# Patient Record
Sex: Male | Born: 1950 | Race: White | Hispanic: Yes | Marital: Married | Smoking: Never smoker
Health system: Southern US, Community
[De-identification: ages and names within clinical notes are randomized; demographics above are authoritative.]

## PROBLEM LIST (undated history)

## (undated) DIAGNOSIS — R569 Unspecified convulsions: Secondary | ICD-10-CM

## (undated) DIAGNOSIS — I639 Cerebral infarction, unspecified: Secondary | ICD-10-CM

## (undated) HISTORY — DX: Unspecified convulsions: R56.9

---

## 2016-03-26 ENCOUNTER — Ambulatory Visit (INDEPENDENT_AMBULATORY_CARE_PROVIDER_SITE_OTHER): Payer: Self-pay

## 2016-03-26 ENCOUNTER — Ambulatory Visit (INDEPENDENT_AMBULATORY_CARE_PROVIDER_SITE_OTHER): Payer: Self-pay | Admitting: Urgent Care

## 2016-03-26 VITALS — BP 142/84 | HR 61 | Temp 98.1°F | Resp 17 | Ht 66.0 in | Wt 164.0 lb

## 2016-03-26 DIAGNOSIS — M5442 Lumbago with sciatica, left side: Secondary | ICD-10-CM

## 2016-03-26 DIAGNOSIS — M549 Dorsalgia, unspecified: Secondary | ICD-10-CM

## 2016-03-26 LAB — POCT CBC
GRANULOCYTE PERCENT: 62.5 % (ref 37–80)
HCT, POC: 47.7 % (ref 43.5–53.7)
Hemoglobin: 16.6 g/dL (ref 14.1–18.1)
Lymph, poc: 2.5 (ref 0.6–3.4)
MCH: 30.3 pg (ref 27–31.2)
MCHC: 34.8 g/dL (ref 31.8–35.4)
MCV: 87.1 fL (ref 80–97)
MID (cbc): 0.4 (ref 0–0.9)
MPV: 7.6 fL (ref 0–99.8)
PLATELET COUNT, POC: 245 10*3/uL (ref 142–424)
POC Granulocyte: 4.9 (ref 2–6.9)
POC LYMPH %: 32.2 % (ref 10–50)
POC MID %: 5.3 %M (ref 0–12)
RBC: 5.47 M/uL (ref 4.69–6.13)
RDW, POC: 13.1 %
WBC: 7.9 10*3/uL (ref 4.6–10.2)

## 2016-03-26 LAB — POCT URINALYSIS DIP (MANUAL ENTRY)
BILIRUBIN UA: NEGATIVE
BILIRUBIN UA: NEGATIVE
Blood, UA: NEGATIVE
Glucose, UA: NEGATIVE
LEUKOCYTES UA: NEGATIVE
Nitrite, UA: NEGATIVE
Protein Ur, POC: NEGATIVE
Spec Grav, UA: 1.025
Urobilinogen, UA: 0.2
pH, UA: 5

## 2016-03-26 LAB — POC MICROSCOPIC URINALYSIS (UMFC): Mucus: ABSENT

## 2016-03-26 MED ORDER — PREDNISONE 20 MG PO TABS: ORAL_TABLET | ORAL | 0 refills | Status: DC

## 2016-03-26 NOTE — Patient Instructions (Addendum)
Tylenol Tome 500mg  cada 6 horas o tome 750mg  cada 8 horas para dolor y inflammacion.    Citica  (Sciatica)  La citica es Chief Technology Officer, debilidad, entumecimiento u hormigueo a lo largo del nervio citico. El nervio comienza en la zona inferior de la espalda y desciende por la parte posterior de cada pierna. El nervio controla los msculos de la parte inferior de la pierna y de la zona posterior de la rodilla, y transmite la sensibilidad a la parte posterior del muslo, la pierna y la planta del pie. La citica es un sntoma de otras afecciones mdicas. Por ejemplo, un dao a los nervios o algunas enfermedades como un disco herniado o un espoln seo en la columna vertebral, podran daarle o presionar en el nervio citico. Esto causa dolor, debilidad y otras sensaciones normalmente asociadas con la citica. Generalmente la citica afecta slo un lado del cuerpo. CAUSAS   Disco herniado o desplazado.  Enfermedad degenerativa del disco.  Un sndrome doloroso que compromete un msculo angosto de los glteos (sndrome piriforme).  Lesin o fractura plvica.  Embarazo.  Tumor (casos raros). SNTOMAS  Los sntomas pueden variar de leves a muy graves. Por lo general, los sntomas descienden desde la zona lumbar a las nalgas y la parte posterior de la pierna. Ellos son:   Hormigueo leve o dolor sordo en la parte inferior de la espalda, la pierna o la cadera.  Adormecimiento en la parte posterior de la pantorrilla o la planta del pie.  Sensacin de [employer] zona lumbar, la pierna o la cadera.  Dolor agudo en la zona inferior de la espalda, la pierna o la cadera.  Debilidad en las piernas.  Dolor de espalda intenso que [employer] movimientos. Los sntomas pueden empeorar al toser, Engineering geologist, rer o estar sentado o parado durante [employer]. Adems, el sobrepeso puede empeorar los sntomas.  DIAGNSTICO  Su mdico le har un examen fsico para buscar los sntomas comunes de la citica.  Le pedir que haga algunos movimientos o actividades que activaran el dolor del nervio citico. Para encontrar las causas de la citica podr indicarle otros estudios. Estos pueden ser:   Anlisis de [place].  Radiografas.  Pruebas de diagnstico por imgenes, como resonancia magntica o tomografa computada. TRATAMIENTO  El tratamiento se dirige a las causas de la citica. A veces, el tratamiento no es necesario, y Chief Technology Officer y Environmental health practitioner desaparecen por s mismos. Si necesita tratamiento, su mdico puede sugerir:   Medicamentos de venta libre para Engineer, materials.  Medicamentos recetados, como antiinflamatorios, relajantes musculares o narcticos.  Aplicacin de calor o hielo en la zona del dolor.  Inyecciones de corticoides para disminuir el dolor, la irritacin y la inflamacin alrededor del nervio.  Reduccin de la [employer] perodos de [place].  Ejercicios y estiramiento del abdomen para fortalecer y Scientist, clinical (histocompatibility and immunogenetics) la flexibilidad de la columna vertebral. Su mdico puede sugerirle perder peso si el peso extra empeora el dolor de espalda.  Fisioterapia.  La ciruga para eliminar lo que presiona o pincha el nervio, como un espoln seo o parte de una hernia de disco. INSTRUCCIONES PARA EL CUIDADO EN EL HOGAR   Slo tome medicamentos de venta libre o recetados para Primary school teacher o Environmental health practitioner, segn las indicaciones de su mdico.  Aplique hielo sobre el rea dolorida durante 20 minutos 3-4 veces por da durante los primeras 48-72 horas. Luego intente aplicar calor de la misma manera.  Haga ejercicios, elongue o realice  sus actividades habituales, si no le causan ms dolor.  Cumpla con todas las sesiones de fisioterapia, segn le indique su mdico.  Cumpla con todas las visitas de control, segn le indique su mdico.  No use tacones altos o zapatos que no tengan buen apoyo.  Verifique que el colchn no sea muy blando. Un colchn firme Engineer, materialsaliviar el dolor y las  [place]molestias. SOLICITE ATENCIN MDICA DE INMEDIATO SI:   Pierde el control de la vejiga o del intestino (incontinencia).  Aumenta la debilidad en la zona inferior de la espalda, la pelvis, las nalgas o las piernas.  Siente irritacin o inflamacin en la espalda.  Tiene sensacin de ardor al ConocoPhillipsorinar.  El dolor empeora cuando se acuesta o lo despierta por la noche.  El dolor es peor del que experiment en el pasado.  Dura ms de 4 semanas.  Pierde peso sin motivo de [place]era sbita. ASEGRESE DE QUE:   Comprende estas instrucciones.  Controlar su enfermedad.  Solicitar ayuda de inmediato si no mejora o si empeora.   Esta informacin no tiene Theme park managercomo fin reemplazar el consejo del mdico. Asegrese de hacerle al mdico cualquier pregunta que tenga.   Document Released: 07/19/2005 Document Revised: 04/09/2015 Elsevier Interactive Patient Education [employer]2016 Elsevier Inc.     IF you received an x-ray today, you will receive an invoice from [facility]. Please contact [facility] at [phone] with questions or concerns regarding your invoice.   IF you received labwork today, you will receive an invoice from [facility]. Please contact [facility] at [phone] with questions or concerns regarding your invoice.   Our billing staff will not be able to assist you with questions regarding bills from these companies.  You will be contacted with the lab results as soon as they are available. The fastest way to get your results is to activate your My Chart account. Instructions are located on the last page of this paperwork. If you have not heard from us regarding the results in 2 weeks, please contact this office.

## 2016-03-26 NOTE — Progress Notes (Signed)
MRN: [id number] DOB: [birth year]  Subjective:   Randall Coffey is a 65 y.o. male presenting for chief complaint of Back Pain and Leg Pain  Reports ~5 day history of low back pain that radiates into his left leg. Pain is sharp and throbbing, constant. Pain started he lifted a heavy carpet. Has had 2 year history of low back pain. Has tried APAP with some relief. Denies falls, trauma, back surgeries, fever, hematuria, n/v, abdominal pain, constipation.   Randall Coffey has a current medication list which includes the following prescription(s): phenobarbital. Also has no allergies on file.  Randall Coffey  has a past medical history of Seizures (HCC). Also  has no past surgical history on file.  Objective:   Vitals: BP (!) 142/84 (BP Location: Right Arm, Patient Position: Sitting, Cuff Size: Normal)   Pulse 61   Temp 98.1 F (36.7 C) (Oral)   Resp 17   Ht 5\' 6"  (1.676 m)   Wt 164 lb (74.4 kg)   SpO2 96%   BMI 26.47 kg/m   Physical Exam  Constitutional: He is oriented to person, place, and time. He appears well-developed and well-nourished.  Cardiovascular: Normal rate.   Pulmonary/Chest: Effort normal.  Abdominal: Soft. Bowel sounds are normal. He exhibits no distension and no mass. There is no tenderness.  No CVA tenderness.  Musculoskeletal:       Lumbar back: He exhibits decreased range of motion (extension and flexion, positive SLR) and spasm. He exhibits no tenderness, no bony tenderness, no swelling, no edema, no deformity and no laceration.  Neurological: He is alert and oriented to person, place, and time. He has normal reflexes.  Skin: Skin is warm and dry.   Results for orders placed or performed in visit on 03/26/16 (from the past 24 hour(s))  POCT CBC     Status: None   Collection Time: 03/26/16  3:54 PM  Result Value Ref Range   WBC 7.9 4.6 - 10.2 K/uL   Lymph, poc 2.5 0.6 - 3.4   POC LYMPH PERCENT 32.2 10 - 50 %L   MID (cbc) 0.4 0 - 0.9   POC MID % 5.3 0 - 12 %M     POC Granulocyte 4.9 2 - 6.9   Granulocyte percent 62.5 37 - 80 %G   RBC 5.47 4.69 - 6.13 M/uL   Hemoglobin 16.6 14.1 - 18.1 g/dL   HCT, POC 40.9 81.1 - 53.7 %   MCV 87.1 80 - 97 fL   MCH, POC 30.3 27 - 31.2 pg   MCHC 34.8 31.8 - 35.4 g/dL   RDW, POC 91.4 %   Platelet Count, POC 245 142 - 424 K/uL   MPV 7.6 0 - 99.8 fL  POCT urinalysis dipstick     Status: None   Collection Time: 03/26/16  4:03 PM  Result Value Ref Range   Color, UA yellow yellow   Clarity, UA clear clear   Glucose, UA negative negative   Bilirubin, UA negative negative   Ketones, POC UA negative negative   Spec Grav, UA 1.025    Blood, UA negative negative   pH, UA 5.0    Protein Ur, POC negative negative   Urobilinogen, UA 0.2    Nitrite, UA Negative Negative   Leukocytes, UA Negative Negative    Dg Lumbar Spine Complete  Result Date: 03/26/2016 CLINICAL DATA:  Low back pain for 2 years without known injury. EXAM: LUMBAR SPINE - COMPLETE 4+ VIEW COMPARISON:  None. FINDINGS: No fracture  or spondylolisthesis is noted. Osteophyte formation is noted at multiple levels. Disc spaces are well-maintained. Posterior facet joints appear intact. IMPRESSION: Mild degenerative changes as described above. No acute abnormality seen in the lumbar spine. Electronically Signed   By: Lupita RaiderJames  Green Jr, M.D.   On: 03/26/2016 16:18     Assessment and Plan :   1. Left-sided low back pain with left-sided sciatica 2. 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 Patient is to use APAP thereafter, back care reviewed. RTC in 1-2 weeks if no improvement, consider referral to PT, ortho.  Wallis BambergMario Elia Nunley, PA-C [facility] [phone] 03/26/2016 3:20 PM

## 2019-09-28 ENCOUNTER — Ambulatory Visit: Payer: Self-pay | Attending: Internal Medicine

## 2019-09-28 DIAGNOSIS — Z23 Encounter for immunization: Secondary | ICD-10-CM | POA: Insufficient documentation

## 2019-09-28 NOTE — Progress Notes (Signed)
   Covid-19 Vaccination Clinic  Name:  Mohammed Mcandrew    MRN: [id number] DOB: 05/07/1951  09/28/2019  Mr. Ramirez-Martinez was observed post Covid-19 immunization for 15 minutes without incidence. He was provided with Vaccine Information Sheet and instruction to access the V-Safe system.   [name] was instructed to call 911 with any severe reactions post vaccine: [name] Difficulty breathing  . Swelling of your face and throat  . A fast heartbeat  . A bad rash all over your body  . Dizziness and weakness    Immunizations Administered    Name Date Dose VIS Date Route   Pfizer COVID-19 Vaccine 09/28/2019 12:35 PM 0.3 mL 07/13/2019 Intramuscular   Manufacturer: [employer], [employer]   Lot: [id number]   NDC: [address]-6

## 2019-10-23 ENCOUNTER — Ambulatory Visit: Payer: Self-pay | Attending: Internal Medicine

## 2019-10-23 DIAGNOSIS — Z23 Encounter for immunization: Secondary | ICD-10-CM

## 2019-10-23 NOTE — Progress Notes (Signed)
   Covid-19 Vaccination Clinic  Name:  Arjan Strohm    MRN: [id number] DOB: 09-Jan-1951  10/23/2019  Mr. Ramirez-Martinez was observed post Covid-19 immunization for 15 minutes without incident. He was provided with Vaccine Information Sheet and instruction to access the V-Safe system.   [name] was instructed to call 911 with any severe reactions post vaccine: [name] Difficulty breathing  . Swelling of face and throat  . A fast heartbeat  . A bad rash all over body  . Dizziness and weakness   Immunizations Administered    Name Date Dose VIS Date Route   Pfizer COVID-19 Vaccine 10/23/2019  3:31 PM 0.3 mL 07/13/2019 Intramuscular   Manufacturer: [employer], [employer]   Lot: [id number]   NDC: [address]-6

## 2019-11-04 ENCOUNTER — Ambulatory Visit (HOSPITAL_COMMUNITY)
Admission: EM | Admit: 2019-11-04 | Discharge: 2019-11-04 | Disposition: A | Discharge status: Home / Self Care | Payer: Self-pay

## 2019-11-04 ENCOUNTER — Emergency Department (HOSPITAL_COMMUNITY): Payer: Self-pay

## 2019-11-04 ENCOUNTER — Observation Stay (HOSPITAL_COMMUNITY): Payer: Self-pay

## 2019-11-04 ENCOUNTER — Observation Stay (HOSPITAL_COMMUNITY)
Admission: EM | Admit: 2019-11-04 | Discharge: 2019-11-06 | Disposition: A | Discharge status: Home / Self Care | Payer: Self-pay | Attending: Family Medicine | Admitting: Family Medicine

## 2019-11-04 ENCOUNTER — Encounter (HOSPITAL_COMMUNITY): Payer: Self-pay | Admitting: Primary Care

## 2019-11-04 ENCOUNTER — Other Ambulatory Visit: Payer: Self-pay

## 2019-11-04 DIAGNOSIS — Z7982 Long term (current) use of aspirin: Secondary | ICD-10-CM | POA: Insufficient documentation

## 2019-11-04 DIAGNOSIS — G40909 Epilepsy, unspecified, not intractable, without status epilepticus: Secondary | ICD-10-CM | POA: Insufficient documentation

## 2019-11-04 DIAGNOSIS — R2990 Unspecified symptoms and signs involving the nervous system: Secondary | ICD-10-CM | POA: Diagnosis present

## 2019-11-04 DIAGNOSIS — Z20822 Contact with and (suspected) exposure to covid-19: Secondary | ICD-10-CM | POA: Insufficient documentation

## 2019-11-04 DIAGNOSIS — I639 Cerebral infarction, unspecified: Principal | ICD-10-CM

## 2019-11-04 DIAGNOSIS — I63239 Cerebral infarction due to unspecified occlusion or stenosis of unspecified carotid arteries: Secondary | ICD-10-CM

## 2019-11-04 DIAGNOSIS — Z79899 Other long term (current) drug therapy: Secondary | ICD-10-CM | POA: Insufficient documentation

## 2019-11-04 LAB — DIFFERENTIAL
Abs Immature Granulocytes: 0.02 10*3/uL (ref 0.00–0.07)
Basophils Absolute: 0 10*3/uL (ref 0.0–0.1)
Basophils Relative: 0 %
Eosinophils Absolute: 0.1 10*3/uL (ref 0.0–0.5)
Eosinophils Relative: 1 %
Immature Granulocytes: 0 %
Lymphocytes Relative: 26 %
Lymphs Abs: 1.9 10*3/uL (ref 0.7–4.0)
Monocytes Absolute: 0.5 10*3/uL (ref 0.1–1.0)
Monocytes Relative: 6 %
Neutro Abs: 4.8 10*3/uL (ref 1.7–7.7)
Neutrophils Relative %: 67 %

## 2019-11-04 LAB — HEMOGLOBIN A1C
Hgb A1c MFr Bld: 6.6 % — ABNORMAL HIGH (ref 4.8–5.6)
Mean Plasma Glucose: 142.72 mg/dL

## 2019-11-04 LAB — COMPREHENSIVE METABOLIC PANEL
ALT: 29 U/L (ref 0–44)
AST: 24 U/L (ref 15–41)
Albumin: 3.8 g/dL (ref 3.5–5.0)
Alkaline Phosphatase: 83 U/L (ref 38–126)
Anion gap: 11 (ref 5–15)
BUN: 14 mg/dL (ref 8–23)
CO2: 28 mmol/L (ref 22–32)
Calcium: 9.2 mg/dL (ref 8.9–10.3)
Chloride: 99 mmol/L (ref 98–111)
Creatinine, Ser: 0.85 mg/dL (ref 0.61–1.24)
GFR calc Af Amer: >60 mL/min (ref >60)
GFR calc non Af Amer: >60 mL/min (ref >60)
Glucose, Bld: 151 mg/dL — ABNORMAL HIGH (ref 70–99)
Potassium: 5.2 mmol/L — ABNORMAL HIGH (ref 3.5–5.1)
Sodium: 138 mmol/L (ref 135–145)
Total Bilirubin: 0.4 mg/dL (ref 0.3–1.2)
Total Protein: 7.5 g/dL (ref 6.5–8.1)

## 2019-11-04 LAB — PROTIME-INR
INR: 1 (ref 0.8–1.2)
Prothrombin Time: 12.6 seconds (ref 11.4–15.2)

## 2019-11-04 LAB — CBC
HCT: 52 % (ref 39.0–52.0)
Hemoglobin: 17.1 g/dL — ABNORMAL HIGH (ref 13.0–17.0)
MCH: 29.8 pg (ref 26.0–34.0)
MCHC: 32.9 g/dL (ref 30.0–36.0)
MCV: 90.6 fL (ref 80.0–100.0)
Platelets: 235 10*3/uL (ref 150–400)
RBC: 5.74 MIL/uL (ref 4.22–5.81)
RDW: 13.7 % (ref 11.5–15.5)
WBC: 7.3 10*3/uL (ref 4.0–10.5)
nRBC: 0 % (ref 0.0–0.2)

## 2019-11-04 LAB — RESPIRATORY PANEL BY RT PCR (FLU A&B, COVID)
Influenza A by PCR: NEGATIVE
Influenza B by PCR: NEGATIVE
SARS Coronavirus 2 by RT PCR: NEGATIVE

## 2019-11-04 LAB — I-STAT CHEM 8, ED
BUN: 17 mg/dL (ref 8–23)
Calcium, Ion: 1.21 mmol/L (ref 1.15–1.40)
Chloride: 101 mmol/L (ref 98–111)
Creatinine, Ser: 0.8 mg/dL (ref 0.61–1.24)
Glucose, Bld: 152 mg/dL — ABNORMAL HIGH (ref 70–99)
HCT: 51 % (ref 39.0–52.0)
Hemoglobin: 17.3 g/dL — ABNORMAL HIGH (ref 13.0–17.0)
Potassium: 5.2 mmol/L — ABNORMAL HIGH (ref 3.5–5.1)
Sodium: 140 mmol/L (ref 135–145)
TCO2: 33 mmol/L — ABNORMAL HIGH (ref 22–32)

## 2019-11-04 LAB — APTT: aPTT: 32 seconds (ref 24–36)

## 2019-11-04 LAB — CBG MONITORING, ED: Glucose-Capillary: 147 mg/dL — ABNORMAL HIGH (ref 70–99)

## 2019-11-04 MED ORDER — ACETAMINOPHEN 325 MG PO TABS: 650.0000 mg | ORAL_TABLET | Freq: Four times a day (QID) | ORAL | Status: DC | PRN

## 2019-11-04 MED ORDER — ADULT MULTIVITAMIN W/MINERALS CH
1.0000 | ORAL_TABLET | Freq: Every day | ORAL | Status: DC
  Administered 2019-11-04 – 2019-11-06 (×3): 1 via ORAL
  Filled 2019-11-04 (×3): qty 1

## 2019-11-04 MED ORDER — ACETAMINOPHEN 650 MG RE SUPP: 650.0000 mg | Freq: Four times a day (QID) | RECTAL | Status: DC | PRN

## 2019-11-04 MED ORDER — IOHEXOL 350 MG/ML SOLN
80.0000 mL | Freq: Once | INTRAVENOUS | Status: AC | PRN
  Administered 2019-11-04: 23:00:00 80 mL via INTRAVENOUS

## 2019-11-04 MED ORDER — GADOBUTROL 1 MMOL/ML IV SOLN
7.4000 mL | Freq: Once | INTRAVENOUS | Status: AC | PRN
  Administered 2019-11-04: 7.4 mL via INTRAVENOUS

## 2019-11-04 MED ORDER — SODIUM CHLORIDE 0.9% FLUSH: 3.0000 mL | Freq: Once | INTRAVENOUS | Status: DC

## 2019-11-04 MED ORDER — PHENOBARBITAL 32.4 MG PO TABS
97.2000 mg | ORAL_TABLET | Freq: Every day | ORAL | Status: DC
  Administered 2019-11-05 – 2019-11-06 (×2): 97.2 mg via ORAL
  Filled 2019-11-04 (×2): qty 3

## 2019-11-04 MED ORDER — ASPIRIN EC 81 MG PO TBEC: 81.0000 mg | DELAYED_RELEASE_TABLET | Freq: Every day | ORAL | Status: DC

## 2019-11-04 MED ORDER — IOHEXOL 300 MG/ML  SOLN: 100.0000 mL | Freq: Once | INTRAMUSCULAR | Status: DC | PRN

## 2019-11-04 MED ORDER — ENOXAPARIN SODIUM 40 MG/0.4ML ~~LOC~~ SOLN
40.0000 mg | SUBCUTANEOUS | Status: DC
  Administered 2019-11-04 – 2019-11-05 (×2): 40 mg via SUBCUTANEOUS
  Filled 2019-11-04 (×2): qty 0.4

## 2019-11-04 MED ORDER — CLOPIDOGREL BISULFATE 75 MG PO TABS
75.0000 mg | ORAL_TABLET | Freq: Every day | ORAL | Status: DC
  Administered 2019-11-04: 22:00:00 75 mg via ORAL
  Filled 2019-11-04: qty 1

## 2019-11-04 NOTE — Consult Note (Addendum)
NEURO HOSPITALIST  CONSULT   Requesting Physician: Dr. Lynelle Doctor    Chief Complaint:  Left arm weakness/numbness  History obtained from:  Patient and Daughter HPI:                                                                                                                                         Randall Coffey is an 69 y.o. male  With PMH seizures who presented to [facility] ED for c/o left side weakness/ numbness.   Patient is spanish speaking only.The patient was offered professional translation services but declined and his daughter-in-law is translating for him. On 4/1 about 630 am patient has a sudden onset of left arm numbness, weakness and left facial droop with difficulty speaking. Symptoms lasted till about 9pm that night and then began to resolve. DIL offered to take him to hospital that day but he refused. 4/3  About 7 am he developed left hand numbness and left arm numbness as well, but not as bad as before and no c/o facial droop.  He denies any prior stroke history, CP, vision changes, trouble walking, smoking. Seizures many years ago for which he is taking phenobarbital. Apparently he was just started on ASA about 1 day ago after family contacted his PCP.   ED course:   CTH: No definite evidence of acute infarction. 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   Modified Rankin: Rankin Score=0  NIHSS: 1   Past Medical History:  Diagnosis Date  . Seizures (HCC)     No family history on file.    Social History:  reports that he has never smoked. He has never used smokeless tobacco. He reports that he does not drink alcohol or use drugs.  Allergies: No Known Allergies  Medications:  Current Facility-Administered Medications  Medication Dose Route Frequency Provider Last Rate Last Admin  . sodium chloride flush (NS) 0.9 % injection 3 mL  3 mL Intravenous Once Linwood Dibbles, MD       Current Outpatient Medications  Medication Sig Dispense Refill  . aspirin EC 81 MG tablet Take 81 mg by mouth daily.    . Multiple Vitamin (MULTIVITAMIN WITH MINERALS) TABS tablet Take 1 tablet by mouth daily.    [name] PHENObarbital (LUMINAL) 100 MG tablet Take 100 mg by mouth daily.   3     ROS:                                                                                                                                       ROS was performed and is negative except as noted in HPI    General Examination:                                                                                                      Blood pressure (!) 148/82, pulse (!) 59, temperature 98.6 F (37 C), temperature source Oral, resp. rate (!) 24, height 5\' 6"  (1.676 m), weight 74.4 kg, SpO2 96 %.  Physical Exam  Constitutional: Appears well-developed and well-nourished.  Psych: Affect appropriate to situation Eyes: Normal external eye and conjunctiva. HENT: Normocephalic, no lesions, without obvious abnormality.   Musculoskeletal-no joint tenderness, deformity or swelling Cardiovascular: Normal rate and regular rhythm.  Respiratory: Effort normal, non-labored breathing saturations WNL GI: Soft.  No distension. There is no tenderness.  Skin: WDI  Neurological Examination Mental Status: Alert, oriented, thought content appropriate.  Speech fluent without evidence of aphasia.  Able to follow  commands without difficulty. Cranial Nerves: II: Visual fields grossly normal,  III,IV, VI: ptosis not present, extra-ocular motions intact bilaterally, pupils equal, round, reactive to light and accommodation V,VII: smile asymmetric, left facial droop facial light touch  sensation normal bilaterally VIII: hearing normal bilaterally IX,X: uvula rises midline XI: bilateral shoulder shrug XII: midline tongue extension Motor: Right : Upper extremity   5/5 Left:     Upper extremity   5/5  Lower extremity   5/5  Lower extremity   5/5 Tone and bulk:normal tone throughout; no atrophy noted Sensory:cool temp and light touch intact throughout, bilaterally Deep Tendon Reflexes: 2+ and symmetric biceps and patella Cerebellar: No ataxia noted Gait: deferred   Lab Results: Basic Metabolic Panel: Recent Labs  Lab 11/04/19 1427 11/04/19 1446  NA 138  140  K 5.2* 5.2*  CL 99 101  CO2 28  --   GLUCOSE 151* 152*  BUN 14 17  CREATININE 0.85 0.80  CALCIUM 9.2  --     CBC: Recent Labs  Lab 11/04/19 1427 11/04/19 1446  WBC 7.3  --   NEUTROABS 4.8  --   HGB 17.1* 17.3*  HCT 52.0 51.0  MCV 90.6  --   PLT 235  --     CBG: Recent Labs  Lab 11/04/19 1452  GLUCAP 147*    Imaging: CT HEAD WO CONTRAST  Result Date: 11/04/2019 CLINICAL DATA:  Left facial droop and left arm numbness. EXAM: CT HEAD WITHOUT CONTRAST TECHNIQUE: Contiguous axial images were obtained from the base of the skull through the vertex without intravenous contrast. COMPARISON:  None. FINDINGS: Brain: No definite evidence of acute infarction, hemorrhage, hydrocephalus, extra-axial collection or mass lesion/mass effect. Possible 1 cm mixed echogenicity nodule at the gray white matter interface of the right frontal lobe Vascular: No hyperdense vessel or unexpected calcification. Skull: Normal. Negative for fracture or focal lesion. Sinuses/Orbits: Mild mucosal thickening of the ethmoid sinuses. Other: None. IMPRESSION: No definite evidence of acute infarction. However there is a nonspecific 1.7 cm nodule at the gray-white matter interface of the right frontal lobe with uncertain etiology. If clinically warranted, further evaluation with brain MRI with contrast may be considered. Mild ethmoid  sinusitis. Electronically Signed   By: Ted Mcalpine M.D.   On: 11/04/2019 16:01   MR Brain W and Wo Contrast  Result Date: 11/04/2019 CLINICAL DATA:  Abnormal CT EXAM: MRI HEAD WITHOUT AND WITH CONTRAST TECHNIQUE: Multiplanar, multiecho pulse sequences of the brain and surrounding structures were obtained without and with intravenous contrast. CONTRAST:  7.44mL GADAVIST GADOBUTROL 1 MMOL/ML IV SOLN COMPARISON:  Earlier same day FINDINGS: Brain: There is cortical/subcortical reduced diffusion along the right precentral and postcentral gyri. There is no evidence of intracranial hemorrhage. There is no intracranial mass or significant mass effect. There is no hydrocephalus or extra-axial fluid collection. Patchy foci of T2 hyperintensity in the supratentorial white matter are nonspecific but probably reflect mild chronic microvascular ischemic changes. Ventricles and sulci are within normal limits in size and configuration. No abnormal enhancement. Vascular: Major vessel flow voids at the skull base are preserved. Skull and upper cervical spine: Normal marrow signal is preserved. Sinuses/Orbits: Minor mucosal thickening.  Orbits are unremarkable. Other: Sella is unremarkable.  Mastoid air cells are clear. 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 Electronically Signed   By: Guadlupe Spanish M.D.   On: 11/04/2019 18:01     Valentina Lucks, MSN, NP-C [facility] [phone] 11/04/2019, 6:38 PM   Assessment: 69 y.o. male with a PMHx of seizures who presented to [facility] ED for c/o left side weakness/ numbness. MRI revealed right frontoparietal infarcts.  1. Exam reveals mild left facial droop.  2. Stroke Risk Factors - none 3. Seizure disorder. On long-term phenobarbital treatment.    Recommendations: -- BP goal : Permissive HTN upto 220/110 mmHg (for 24 hr post admission)   -- CTA head and neck  -- Echocardiogram -- Prophylactic therapy-  Continue ASA. Can discontinue Plavix, as per daughter, ASA was started by PCP after the patient's stroke symptoms started.  -- Start atorvastatin. Obtain baseline CK level -- HgbA1c, fasting lipid panel -- PT consult, OT consult, Speech consult --Telemetry monitoring -- Frequent neuro checks -- Stroke swallow screen  -- Continue phenobarbital.  -- Please page stroke  NP  Or  PA  Or MD from 8am -4 pm  as this patient from this time will be  followed by the stroke.   You can look them up on [email or web address]  Password [email or web address]  I have seen and examined the patient. I have formulated the assessment and recommendations. 69 year old male with new onset of LUE weakness with sensory disturbance. Exam reveals left facial droop. MRI reveals acute strokes in the right frontoparietal region. Will need stroke work up. Have escalated to DAPT.  Electronically signed: Dr. Kerney Elbe

## 2019-11-04 NOTE — ED Notes (Signed)
Evaluated pt in waiting room, with daughter serving as interpreter and providing additional facts. Pt states on Thursday he experienced acute onset facial drooping, left arm weakness with inability to raise/move left arm, inability to speak and nausea. States today he just feels generally weak. This RN spoke with Patterson Hammersmith, PA who recommended/advised pt to go directly to ER for possible CT scan to r/o CVA.  Explained to pt and daughter medical recommendation for ER/CT. Daughter agrees and verbalizes understanding and will drive pt directly to ER.

## 2019-11-04 NOTE — ED Triage Notes (Signed)
Onset  11-01-19 days ago left facial droop and left arm numbness.  Not able to drink water d/t dribbling out of mouth.  Pt was not able to get his words out and resolved same day.  Not able to use left arm, is now able to use left arm and hand grips equal.  Pt states Left arm just "feels sleepy now". No difficulty walking.   Slight left facial droop today.

## 2019-11-04 NOTE — ED Provider Notes (Signed)
[facility] EMERGENCY DEPARTMENT Provider Note   CSN: [id number] Arrival date & time: 11/04/19  1419     History Chief Complaint  Patient presents with  . Facial Droop  . Numbness    Randall Coffey is a 69 y.o. male who presents for evaluation of left arm numbness.  There is a language barrier.  The patient was offered professional translation services but declined and his daughter-in-law is translating for him.  The patient had sudden onset of left arm numbness, weakness, left-sided facial droop and aphasia with difficulty speaking at 6:30 AM on 11/01/2018.  His symptoms lasted until about 8 or 9 PM that day and then resolved.  On Friday the patient had no symptoms.  Saturday morning at 7 AM he developed left hand numbness and arm numbness which has been present since that time.  It is waxing and waning.  He has never had anything like this before.  He denies a history of smoking, hyperlipidemia, hypertension.  He has a distant history of seizures many many years ago and is currently still taking phenobarbital and has not had a seizure since he was very young.  The patient has no other complaints at this time  HPI     Past Medical History:  Diagnosis Date  . Seizures [facility])     Patient Active Problem List   Diagnosis Date Noted  . Stroke-like symptoms 11/04/2019     The histories are not reviewed yet. Please review them in the "History" navigator section and refresh this SmartLink.     History reviewed. No pertinent family history.  Social History   Tobacco Use  . Smoking status: Never Smoker  . Smokeless tobacco: Never Used  Substance Use Topics  . Alcohol use: No  . Drug use: No    Home Medications Prior to Admission medications   Medication Sig Start Date End Date Taking? Authorizing Provider  aspirin EC 81 MG tablet Take 81 mg by mouth daily.   Yes [provider]  Multiple Vitamin (MULTIVITAMIN WITH MINERALS) TABS tablet Take 1  tablet by mouth daily.   Yes [provider]  PHENObarbital (LUMINAL) 100 MG tablet Take 100 mg by mouth daily.  12/28/15  Yes [provider]    Allergies    Patient has no known allergies.  Review of Systems   Review of Systems  Constitutional: Negative.   HENT: Negative.   Eyes: Negative for visual disturbance.  Respiratory: Negative.   Cardiovascular: Negative.   Gastrointestinal: Negative.   Neurological: Positive for facial asymmetry, speech difficulty, weakness and numbness.    Physical Exam Updated Vital Signs BP (!) 150/79   Pulse 68   Temp 98.6 F (37 C) (Oral)   Resp (!) 23   Ht 5\' 6"  (1.676 m)   Wt 74.4 kg   SpO2 96%   BMI 26.47 kg/m   Physical Exam Vitals and nursing note reviewed.  Constitutional:      General: He is not in acute distress.    Appearance: He is well-developed. He is not diaphoretic.  HENT:     Head: Normocephalic and atraumatic.  Eyes:     General: No scleral icterus.    Conjunctiva/sclera: Conjunctivae normal.  Cardiovascular:     Rate and Rhythm: Normal rate and regular rhythm.     Heart sounds: Normal heart sounds.  Pulmonary:     Effort: Pulmonary effort is normal. No respiratory distress.     Breath sounds: Normal breath sounds.  Abdominal:     Palpations: Abdomen is soft.     Tenderness: There is no abdominal tenderness.  Musculoskeletal:     Cervical back: Normal range of motion and neck supple.  Skin:    General: Skin is warm and dry.  Neurological:     Mental Status: He is alert.     Comments: Speech is clear and goal oriented, follows commands Major Cranial nerves without deficit, no facial droop Normal strength in upper and lower extremities bilaterally including dorsiflexion and plantar flexion, strong and equal grip strength Sensation normal to light and sharp touch Moves extremities without ataxia, coordination intact Normal finger to nose and rapid alternating movements Neg romberg, no pronator  drift Normal gait Normal heel-shin and balance   Psychiatric:        Behavior: Behavior normal.     ED Results / Procedures / Treatments   Labs (all labs ordered are listed, but only abnormal results are displayed) Labs Reviewed  CBC - Abnormal; Notable for the following components:      Result Value   Hemoglobin 17.1 (*)    All other components within normal limits  COMPREHENSIVE METABOLIC PANEL - Abnormal; Notable for the following components:   Potassium 5.2 (*)    Glucose, Bld 151 (*)    All other components within normal limits  I-STAT CHEM 8, ED - Abnormal; Notable for the following components:   Potassium 5.2 (*)    Glucose, Bld 152 (*)    TCO2 33 (*)    Hemoglobin 17.3 (*)    All other components within normal limits  CBG MONITORING, ED - Abnormal; Notable for the following components:   Glucose-Capillary 147 (*)    All other components within normal limits  RESPIRATORY PANEL BY RT PCR (FLU A&B, COVID)  PROTIME-INR  APTT  DIFFERENTIAL  HIV ANTIBODY (ROUTINE TESTING W REFLEX)    EKG EKG Interpretation  Date/Time:  Sunday November 04 2019 14:20:13 EDT Ventricular Rate:  71 PR Interval:  172 QRS Duration: 90 QT Interval:  384 QTC Calculation: 417 R Axis:   78 Text Interpretation: Normal sinus rhythm Normal ECG No old tracing to compare Confirmed by Dorie Rank [phone]) on 11/04/2019 4:16:31 PM   Radiology CT HEAD WO CONTRAST  Result Date: 11/04/2019 CLINICAL DATA:  Left facial droop and left arm numbness. EXAM: CT HEAD WITHOUT CONTRAST TECHNIQUE: Contiguous axial images were obtained from the base of the skull through the vertex without intravenous contrast. COMPARISON:  None. FINDINGS: Brain: No definite evidence of acute infarction, hemorrhage, hydrocephalus, extra-axial collection or mass lesion/mass effect. Possible 1 cm mixed echogenicity nodule at the gray white matter interface of the right frontal lobe Vascular: No hyperdense vessel or unexpected  calcification. Skull: Normal. Negative for fracture or focal lesion. Sinuses/Orbits: Mild mucosal thickening of the ethmoid sinuses. Other: None. IMPRESSION: No definite evidence of acute infarction. However there is a nonspecific 1.7 cm nodule at the gray-white matter interface of the right frontal lobe with uncertain etiology. If clinically warranted, further evaluation with brain MRI with contrast may be considered. Mild ethmoid sinusitis. Electronically Signed   By: Fidela Salisbury M.D.   On: 11/04/2019 16:01   MR Brain W and Wo Contrast  Result Date: 11/04/2019 CLINICAL DATA:  Abnormal CT EXAM: MRI HEAD WITHOUT AND WITH CONTRAST TECHNIQUE: Multiplanar, multiecho pulse sequences of the brain and surrounding structures were obtained without and with intravenous contrast. CONTRAST:  7.31mL GADAVIST GADOBUTROL 1 MMOL/ML IV SOLN COMPARISON:  Earlier same day  FINDINGS: Brain: There is cortical/subcortical reduced diffusion along the right precentral and postcentral gyri. There is no evidence of intracranial hemorrhage. There is no intracranial mass or significant mass effect. There is no hydrocephalus or extra-axial fluid collection. Patchy foci of T2 hyperintensity in the supratentorial white matter are nonspecific but probably reflect mild chronic microvascular ischemic changes. Ventricles and sulci are within normal limits in size and configuration. No abnormal enhancement. Vascular: Major vessel flow voids at the skull base are preserved. Skull and upper cervical spine: Normal marrow signal is preserved. Sinuses/Orbits: Minor mucosal thickening.  Orbits are unremarkable. Other: Sella is unremarkable.  Mastoid air cells are clear. 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 Electronically Signed   By: Guadlupe Spanish M.D.   On: 11/04/2019 18:01    Procedures .Critical Care Performed by: Arthor Captain, PA-C Authorized by: Arthor Captain, PA-C    Critical care provider statement:    Critical care time (minutes):  50   Critical care time was exclusive of:  Separately billable procedures and treating other patients   Critical care was necessary to treat or prevent imminent or life-threatening deterioration of the following conditions:  CNS failure or compromise   Critical care was time spent personally by me on the following activities:  Discussions with consultants, evaluation of patient's response to treatment, examination of patient, ordering and performing treatments and interventions, ordering and review of laboratory studies, ordering and review of radiographic studies, pulse oximetry, re-evaluation of patient's condition, obtaining history from patient or surrogate and review of old charts   (including critical care time)  Medications Ordered in ED Medications  sodium chloride flush (NS) 0.9 % injection 3 mL (3 mLs Intravenous Not Given 11/04/19 1518)  aspirin EC tablet 81 mg (has no administration in time range)  PHENObarbital (LUMINAL) tablet 100 mg (has no administration in time range)  multivitamin with minerals tablet 1 tablet (has no administration in time range)  enoxaparin (LOVENOX) injection 40 mg (has no administration in time range)  acetaminophen (TYLENOL) tablet 650 mg (has no administration in time range)    Or  acetaminophen (TYLENOL) suppository 650 mg (has no administration in time range)  clopidogrel (PLAVIX) tablet 75 mg (has no administration in time range)  gadobutrol (GADAVIST) 1 MMOL/ML injection 7.4 mL (7.4 mLs Intravenous Contrast Given 11/04/19 1747)    ED Course  I have reviewed the triage vital signs and the nursing notes.  Pertinent labs & imaging results that were available during my care of the patient were reviewed by me and considered in my medical decision making (see chart for details).    MDM Rules/Calculators/A&P                      This patient complains of Left arm numbness, this  involves an extensive number of treatment options, and is a complaint that carries with it a high risk of complications and morbidity.  The differential diagnosis includes The differential diagnosis of paresthesias includes but is not limited QM:GNOIBBCWUG, diabetic neuropathy, diabetes mellitus, entrapment neuropathy,hypocalcemia, multiple sclerosis, spinal cord lesion, nerve root compression, herpes zoster, transient ischemic attack, CVA, Guillain-Barr syndrome, migraine, partial seizure, reflex sympathetic dystrophy, thoracic outlet syndrome, brachial plexus neuropathy.   I Ordered, reviewed, and interpreted labs, which included a CBG which shows a slightly elevated blood glucose, PT/INR and APTT within normal limits, CBC shows elevated hemoglobin level of insignificant value.  CMP shows hyperkalemia without any elevation in the patient's BUN  or creatinine.  Covid test is pending.   I ordered imaging studies which included CT head without contrast and MR brain with and without contrast and I independently visualized and interpreted imaging which showed no acute strokes on my interpretation.  Radiology read a small nodule and I therefore ordered MRI with and without to characterize potential mass lesion.  MRI does show acute strokes on the right side Additional history obtained from patient's daughter-in-law at bedside Previous records obtained and reviewed  I consulted Dr. Otelia Limes of the neurology service who will consult on the patient for acute stroke, family medicine resident service who will admit the patient and discussed lab and imaging findings  Patient stable and will be admitted for acute stroke work-up. Final Clinical Impression(s) / ED Diagnoses Final diagnoses:  Acute CVA (cerebrovascular accident) [facility])    Rx / DC Orders ED Discharge Orders    None       Arthor Captain, PA-C 11/04/19 2157    Linwood Dibbles, MD 11/05/19 [phone]

## 2019-11-04 NOTE — H&P (Addendum)
[facility] Admission History and Physical Service Pager: [phone]  Patient name: Randall Coffey Medical record number: [id number] Date of birth: [birth year] Age: 69 y.o. Gender: male  Primary Care Provider: Patient, No Pcp Per Consultants: Neuro Code Status: full  Preferred Emergency Contact:  Contact Information    Name Relation Home Work Mobile   Ramirez,Amanda Daughter [phone]       Chief Complaint: left side numbness and weakness  Assessment and Plan: Randall Coffey is a 69 y.o. male presenting with Left side weakness and numbness. PMH is significant for seizures.  Left Side Weakness/Numbness likely secondary to acute Rt frontoparietal CVA Patient has recently had three episodes this past month for sensory changes in bilateral arms. Once occurring in the Right arm the other two episodes in the Left and the most recent having complete loss of movement of Lt arm along with facial drooping. All of these episodes lasted between 1-2 hrs with resolve of symptoms.  CT head negative for acute infarct.  Did show nonspecific 1.7 cm nodule at the gray-white matter interface of the Right frontal lobe.  MRI brain impressive for small acute right frontoparietal infarct involving the primary sensorimotor gyri. ECG normal sinus rhythm. Neuro consulted and evaluated in the ED. On exam pt well appearing, mildly diaphoretic. Neuro exam positive for left side facial droop. Patient has mildly asymmetric smile that seemed to improve with wider smile. He cannot puff right check, otherwise, remainder of CN testing wnl. Uvula difficult to observe. Left upper and lower extremity weakness and intact symmetric sensation in upper/lower extremities. Patient reports continued subjective tingling in left arm and hand.  Differential for left sided weakness includes TIA, Seizures, Space occupying lesion.  Patient has history of seizures last one being 8 years ago and  presents differently than current symptoms. He has had no LOC, incontinence, or jerky body movement so seems less likely. CT head and MRI brain negative for space occupying lesions.  TIA likely presented two weeks ago given that symptoms didn't last long and resolved.  Also consider nerve compression as cause of numbness and tinging in left arm as symptoms also present when sleeping on left side and resolve when turns to opposite side. Could consider C-spine imaging looking for compression if left arm symptoms do not resolve as outpatient. Through, even with an isolated radiculopathy of LUE, would not account for weakness of entire left side. Will admit for risk stratification and to obtain CTA head/neck and ECHO to evaluate for cardiac causes. -Admit Med Surg, Attending Dr. Erin Hearing -Neuro consulted in ED, appreciate recommendation -CTA head/neck -ECHO -Prophylactic ASA 81 mg and Plavix 75 mg, most likely will need DAPT for 8months -Atorvastatin 80mg  daily -HbA1c, fasting lipids,TSH -Pt/OT/Speech  -Bedside swallow -Neuro q4h -Vitals q4h -permissive HTN 220/110 for 24-48 hrs post admission  High Blood Pressure Patient with consistently elevated BP in the ED, likely chronic, but patient does not see a doctor regularly. Would be risk factor for stroke. He will need PCP at discharge. Permissive HTN as above.  - O/P f/u for HTN control   History of Seizures Chronic.  Home medications Phenobarbital 100 mg daily. Daughter reports last seizure was 8 years ago.  He obtains his prescription from Mahala Menghini, NP.  -Seizure precautions -Continue Phenobarbital 100 mg daily -Recommend phenobarb level at o/p f/u  Left side Back pain with sciatica Chronic. Stable.  He takes Ibuprofen or Tylenol for pain as needed -Continue to monitor -Tylenol prn for pain  Hyperglycemia  Serum glucose 151 on admission. No history of diabetes.  Likely secondary to physiological stress. -HbA1c -Fasting CBG  x1 -Continue to monitor  Hyperkalemia Unclear etiology.  Not taking any potassium sparing medications.  EKG NSR and no complaints of chest pain.  -Continue to monitor -BMP in am  FEN/GI:  -passed bedside swallow study -Heart Healthy  -PIV  Prophylaxis:  -Lovenox  Disposition: admit to med-surg   History of Present Illness:  Randall Coffey is a 69 y.o. male presenting with left upper extremity numbness. He is accompanied by his daughter, who serves as his Nurse, learning disability and historian (as they decline interperator services). On Thursday, daughter reports patient was reversing in the car and suddenly could not use his left arm and the left side of his face drooped. No syncope, headache, lightheadedness.  Daughter reports that he had similar symptoms two weeks ago involving his right arm.  States that it was not working and he became nauseous at that time.  About 1 week later he had another episode involving his left arm and nausea. Denies any chest pain, shortness of breath or abdominal pain.  No LOC or seizure like activity.  She reports he is very active and is now concerned given this event she thinks he has some depression as well.  Does not report any fevers or sick contacts.  Appetite remains good.    In the ED he was afebrile and hypertensive 156/83.  CT head and MRI completed. Neurology consulted and evaluated.  Labs unremarkable.   Review Of Systems: Per HPI with the following additions:   Review of Systems  Constitutional: Negative for chills and fever.  HENT: Negative for hearing loss.   Eyes: Negative for blurred vision and double vision.  Respiratory: Negative for cough and shortness of breath.   Cardiovascular: Negative for chest pain, palpitations and leg swelling.  Gastrointestinal: Positive for blood in stool and constipation. Negative for abdominal pain, diarrhea, nausea and vomiting.  Genitourinary: Negative for dysuria, hematuria and urgency.  Musculoskeletal:  Negative for neck pain.  Neurological: Positive for sensory change, focal weakness and seizures. Negative for dizziness, speech change, loss of consciousness and headaches.  Psychiatric/Behavioral: Positive for depression. Negative for substance abuse.    Patient Active Problem List   Diagnosis Date Noted  . Stroke-like symptoms 11/04/2019   Past Medical History: Past Medical History:  Diagnosis Date  . Seizures (HCC)    Past Surgical History: None  Social History: Social History   Tobacco Use  . Smoking status: Never Smoker  . Smokeless tobacco: Never Used  Substance Use Topics  . Alcohol use: No  . Drug use: No   Additional social history: Spanish speaking  Please also refer to relevant sections of EMR.  Family History: History reviewed. No pertinent family history.  Allergies and Medications: No Known Allergies No current facility-administered medications on file prior to encounter.   Current Outpatient Medications on File Prior to Encounter  Medication Sig Dispense Refill  . aspirin EC 81 MG tablet Take 81 mg by mouth daily.    . Multiple Vitamin (MULTIVITAMIN WITH MINERALS) TABS tablet Take 1 tablet by mouth daily.    [name] PHENObarbital (LUMINAL) 100 MG tablet Take 100 mg by mouth daily.   3    Objective: BP 131/76 (BP Location: Left Arm)   Pulse 72   Temp 98.6 F (37 C) (Oral)   Resp 20   Ht 5\' 6"  (1.676 m)   Wt 74.4 kg   SpO2 94%  BMI 26.47 kg/m  Exam: General: 69 y.o well appearing male in no acute distress Eyes: PERRLA, EOMs intact ENTM: normocephalic, mucus membranes moist, Neck: no elevated JVD, no carotid bruits appreciated Cardiovascular: RRR, no murmurs appreciated, distal pulses present Respiratory: CTAB, no wheezing or crackles appreciated, good cap refill Gastrointestinal: soft, nontender, non-distended.  BS present MSK: 5/5 strength Rt side extremities, 4/5 strength Lt side extremities. Normal ROM cervical spine.  Derm: no rashes or  abrasion Neuro: Alert and oriented x 3, left facial droop with assymetric smile, CN V-IV, VIII-XII intact  no ataxia, motor and sensory intact, no ataxia,normal shin to shin,  Labs and Imaging: CBC BMET  Recent Labs  Lab 11/04/19 1427 11/04/19 1427 11/04/19 1446  WBC 7.3  --   --   HGB 17.1*   < > 17.3*  HCT 52.0   < > 51.0  PLT 235  --   --    < > = values in this interval not displayed.   Recent Labs  Lab 11/04/19 1427 11/04/19 1427 11/04/19 1446  NA 138   < > 140  K 5.2*   < > 5.2*  CL 99   < > 101  CO2 28  --   --   BUN 14   < > 17  CREATININE 0.85   < > 0.80  GLUCOSE 151*   < > 152*  CALCIUM 9.2  --   --    < > = values in this interval not displayed.     EKG: NSR  CT HEAD WO CONTRAST No definite evidence of acute infarction. However there is a nonspecific 1.7 cm nodule at the gray-white matter interface of the right frontal lobe with uncertain etiology. If clinically warranted, further evaluation with brain MRI with contrast may be considered. Mild ethmoid sinusitis.  MR Brain W and Wo Contrast Small acute right frontoparietal infarcts involving the primary sensorimotor gyri. Electronically Signed      Dana Allan, MD 11/04/2019, 11:18 PM PGY-1, [facility] Family Medicine FPTS Intern pager: [phone], text pages welcome  FPTS Upper-Level Resident Addendum I have independently interviewed and examined the patient. I have discussed the above with the original author and agree with their documentation. My edits for correction/addition/clarification are in - purple. Please see also any attending notes.  FPTS Service pager: [phone] (text pages welcome through [facility])  Melene Plan, M.D.  PGY-2 11/05/2019 12:15 AM

## 2019-11-04 NOTE — ED Notes (Signed)
Patient transported to MRI 

## 2019-11-05 ENCOUNTER — Observation Stay (HOSPITAL_BASED_OUTPATIENT_CLINIC_OR_DEPARTMENT_OTHER): Payer: Self-pay

## 2019-11-05 DIAGNOSIS — I639 Cerebral infarction, unspecified: Secondary | ICD-10-CM

## 2019-11-05 DIAGNOSIS — I6521 Occlusion and stenosis of right carotid artery: Secondary | ICD-10-CM

## 2019-11-05 DIAGNOSIS — I6523 Occlusion and stenosis of bilateral carotid arteries: Secondary | ICD-10-CM

## 2019-11-05 DIAGNOSIS — I6389 Other cerebral infarction: Secondary | ICD-10-CM

## 2019-11-05 DIAGNOSIS — E782 Mixed hyperlipidemia: Secondary | ICD-10-CM

## 2019-11-05 DIAGNOSIS — E1159 Type 2 diabetes mellitus with other circulatory complications: Secondary | ICD-10-CM

## 2019-11-05 LAB — LIPID PANEL
Cholesterol: 315 mg/dL — ABNORMAL HIGH (ref 0–200)
HDL: 41 mg/dL (ref >40)
LDL Cholesterol: 197 mg/dL — ABNORMAL HIGH (ref 0–99)
Total CHOL/HDL Ratio: 7.7 RATIO
Triglycerides: 387 mg/dL — ABNORMAL HIGH (ref <150)
VLDL: 77 mg/dL — ABNORMAL HIGH (ref 0–40)

## 2019-11-05 LAB — ECHOCARDIOGRAM COMPLETE
Height: 66 in
Weight: 2624.36 oz

## 2019-11-05 LAB — GLUCOSE, CAPILLARY: Glucose-Capillary: 104 mg/dL — ABNORMAL HIGH (ref 70–99)

## 2019-11-05 LAB — PHENOBARBITAL LEVEL: Phenobarbital: 11.6 ug/mL — ABNORMAL LOW (ref 15.0–30.0)

## 2019-11-05 LAB — HIV ANTIBODY (ROUTINE TESTING W REFLEX): HIV Screen 4th Generation wRfx: NONREACTIVE

## 2019-11-05 LAB — BASIC METABOLIC PANEL
Anion gap: 11 (ref 5–15)
BUN: 13 mg/dL (ref 8–23)
CO2: 27 mmol/L (ref 22–32)
Calcium: 8.6 mg/dL — ABNORMAL LOW (ref 8.9–10.3)
Chloride: 103 mmol/L (ref 98–111)
Creatinine, Ser: 0.74 mg/dL (ref 0.61–1.24)
GFR calc Af Amer: >60 mL/min (ref >60)
GFR calc non Af Amer: >60 mL/min (ref >60)
Glucose, Bld: 108 mg/dL — ABNORMAL HIGH (ref 70–99)
Potassium: 4.1 mmol/L (ref 3.5–5.1)
Sodium: 141 mmol/L (ref 135–145)

## 2019-11-05 MED ORDER — ATORVASTATIN CALCIUM 80 MG PO TABS
80.0000 mg | ORAL_TABLET | Freq: Every day | ORAL | Status: DC
  Administered 2019-11-05: 17:00:00 80 mg via ORAL
  Filled 2019-11-05: qty 1

## 2019-11-05 MED ORDER — ASPIRIN EC 325 MG PO TBEC
325.0000 mg | DELAYED_RELEASE_TABLET | Freq: Every day | ORAL | Status: DC
  Administered 2019-11-05 – 2019-11-06 (×2): 325 mg via ORAL
  Filled 2019-11-05 (×2): qty 1

## 2019-11-05 MED ORDER — CLOPIDOGREL BISULFATE 75 MG PO TABS
225.0000 mg | ORAL_TABLET | Freq: Once | ORAL | Status: AC
  Administered 2019-11-05: 225 mg via ORAL
  Filled 2019-11-05: qty 3

## 2019-11-05 MED ORDER — CLOPIDOGREL BISULFATE 75 MG PO TABS
75.0000 mg | ORAL_TABLET | Freq: Every day | ORAL | Status: DC
  Administered 2019-11-06: 75 mg via ORAL
  Filled 2019-11-05: qty 1

## 2019-11-05 NOTE — Consult Note (Addendum)
Hospital Consult    Reason for Consult:  Stroke, carotid stenosis Requesting Physician:  Dr. Sharol Harness MRN #:  [id number]  History of Present Illness: This is a 69 y.o. male who presented to the ED due to left upper extremity weakness. He is spanish speaking. The patients daughter assisted in translating. He has pertinent medical history including seizures and hypertension. He reports that on Thursday he noticed while trying to reverse his car that his left arm was not working as normal and very weak and additionally some left sided facial drooping. Apparently there has been over the past several weeks a few episodes similar to this involving both the left and right upper extremity per medical record but this was not reported to [place]. The daughter in law states that he has never had a TIA or stroke before.  He denies any previous neck reck radiation or surgeries. He currently feels that he is back to his baseline with no residual upper or lower extremity weakness, facial drooping, slurred speech or visual changes  Past Medical History:  Diagnosis Date  . Seizures (HCC)    No Known Allergies  Prior to Admission medications   Medication Sig Start Date End Date Taking? Authorizing Provider  aspirin EC 81 MG tablet Take 81 mg by mouth daily.   Yes [provider]  Multiple Vitamin (MULTIVITAMIN WITH MINERALS) TABS tablet Take 1 tablet by mouth daily.   Yes [provider]  PHENObarbital (LUMINAL) 100 MG tablet Take 100 mg by mouth daily.  12/28/15  Yes [provider]    Social History   Socioeconomic History  . Marital status: Married    Spouse name: Not on file  . Number of children: Not on file  . Years of education: Not on file  . Highest education level: Not on file  Occupational History  . Not on file  Tobacco Use  . Smoking status: Never Smoker  . Smokeless tobacco: Never Used  Substance and Sexual Activity  . Alcohol use: No  . Drug use: No  . Sexual  activity: Never  Other Topics Concern  . Not on file  Social History Narrative  . Not on file   Social Determinants of Health   Financial Resource Strain:   . Difficulty of Paying Living Expenses:   Food Insecurity:   . Worried About Programme researcher, broadcasting/film/video in the Last Year:   . Barista in the Last Year:   Transportation Needs:   . Freight forwarder (Medical):   [name] Lack of Transportation (Non-Medical):   Physical Activity:   . Days of Exercise per Week:   . Minutes of Exercise per Session:   Stress:   . Feeling of Stress :   Social Connections:   . Frequency of Communication with Friends and Family:   . Frequency of Social Gatherings with Friends and Family:   . Attends Religious Services:   . Active Member of Clubs or Organizations:   . Attends Banker Meetings:   [name] Marital Status:   Intimate Partner Violence:   . Fear of Current or Ex-Partner:   . Emotionally Abused:   [name] Physically Abused:   . Sexually Abused:      History reviewed. No pertinent family history.  ROS: Otherwise negative unless mentioned in HPI  Physical Examination  Vitals:   11/05/19 0853 11/05/19 1237  BP: 132/70 (!) 142/90  Pulse: 71 (!) 59  Resp: 18 (!) 23  Temp:  98.6  F (37 C)  SpO2: 94% 93%   Body mass index is 26.47 kg/m.  General: well developed and well nourished, comfortable lying in bed Gait: Not observed HENT: WNL, normocephalic Pulmonary: normal non-labored breathing Cardiac: regular Abdomen: non distended Extremities: without ischemic changes, without Gangrene , without cellulitis; without open wounds;  Musculoskeletal: no muscle wasting or atrophy  Neurologic: A&O X 3;  No focal weakness or paresthesias are detected; speech is fluent/normal Psychiatric:  The pt has Normal affect.   CBC    Component Value Date/Time   WBC 7.3 11/04/2019 1427   RBC 5.74 11/04/2019 1427   HGB 17.3 (H) 11/04/2019 1446   HCT 51.0 11/04/2019 1446   PLT 235  11/04/2019 1427   MCV 90.6 11/04/2019 1427   MCV 87.1 03/26/2016 1554   MCH 29.8 11/04/2019 1427   MCHC 32.9 11/04/2019 1427   RDW 13.7 11/04/2019 1427   LYMPHSABS 1.9 11/04/2019 1427   MONOABS 0.5 11/04/2019 1427   EOSABS 0.1 11/04/2019 1427   BASOSABS 0.0 11/04/2019 1427    BMET    Component Value Date/Time   NA 141 11/05/2019 0442   K 4.1 11/05/2019 0442   CL 103 11/05/2019 0442   CO2 27 11/05/2019 0442   GLUCOSE 108 (H) 11/05/2019 0442   BUN 13 11/05/2019 0442   CREATININE 0.74 11/05/2019 0442   CALCIUM 8.6 (L) 11/05/2019 0442   GFRNONAA >60 11/05/2019 0442   GFRAA >60 11/05/2019 0442    COAGS: Lab Results  Component Value Date   INR 1.0 11/04/2019      Non-Invasive Vascular Imaging:   CT ANGIOGRAPHY HEAD AND NECK  TECHNIQUE: Multidetector CT imaging of the head and neck was performed using the standard protocol during bolus administration of intravenous contrast. Multiplanar CT image reconstructions and MIPs were obtained to evaluate the vascular anatomy. Carotid stenosis measurements (when applicable) are obtained utilizing NASCET criteria, using the distal internal carotid diameter as the denominator.  CONTRAST:  35mL OMNIPAQUE IOHEXOL 350 MG/ML SOLN  COMPARISON:  None.  FINDINGS: CT HEAD FINDINGS  Brain: There is no mass, hemorrhage or extra-axial collection. The size and configuration of the ventricles and extra-axial CSF spaces are normal. There is no acute or chronic infarction. The brain parenchyma is normal.  Skull: The visualized skull base, calvarium and extracranial soft tissues are normal.  Sinuses/Orbits: No fluid levels or advanced mucosal thickening of the visualized paranasal sinuses. No mastoid or middle ear effusion. The orbits are normal.  CTA NECK FINDINGS  SKELETON: There is no bony spinal canal stenosis. No lytic or blastic lesion.  OTHER NECK: Normal pharynx, larynx and major salivary glands. No cervical  lymphadenopathy. Unremarkable thyroid gland.  UPPER CHEST: No pneumothorax or pleural effusion. No nodules or masses.  AORTIC ARCH:  There is no calcific atherosclerosis of the aortic arch. There is no aneurysm, dissection or hemodynamically significant stenosis of the visualized portion of the aorta. Conventional 3 vessel aortic branching pattern. The visualized proximal subclavian arteries are widely patent.  RIGHT CAROTID SYSTEM: There is a moderate amount of irregular plaque within the proximal right internal carotid arteries. No hemodynamically significant stenosis.  LEFT CAROTID SYSTEM: No dissection, occlusion or aneurysm. There is predominantly hypodense atherosclerosis extending into the proximal ICA, resulting in less than 50% stenosis.  VERTEBRAL ARTERIES: Left dominant configuration. Both origins are clearly patent. There is no dissection, occlusion or flow-limiting stenosis to the skull base (V1-V3 segments).  CTA HEAD FINDINGS  POSTERIOR CIRCULATION:  --Vertebral arteries: Normal V4 segments.  --  Posterior inferior cerebellar arteries (PICA): Patent origins from the vertebral arteries.  --Anterior inferior cerebellar arteries (AICA): Patent origins from the basilar artery.  --Basilar artery: Normal.  --Superior cerebellar arteries: Normal.  --Posterior cerebral arteries: Normal. Both originate from the basilar artery. Posterior communicating arteries (p-comm) are diminutive or absent.  ANTERIOR CIRCULATION:  --Intracranial internal carotid arteries: Normal.  --Anterior cerebral arteries (ACA): Normal. Both A1 segments are present. Patent anterior communicating artery (a-comm).  --Middle cerebral arteries (MCA): Normal.  VENOUS SINUSES: As permitted by contrast timing, patent.  ANATOMIC VARIANTS: None  Review of the MIP images confirms the above findings.  IMPRESSION: 1. No emergent large vessel occlusion or  hemodynamically significant stenosis. 2. Bilateral carotid bifurcation atherosclerosis without hemodynamically significant stenosis by NASCET criteria.   MRI HEAD WITHOUT AND WITH CONTRAST  TECHNIQUE: Multiplanar, multiecho pulse sequences of the brain and surrounding structures were obtained without and with intravenous contrast.  CONTRAST:  7.88mL GADAVIST GADOBUTROL 1 MMOL/ML IV SOLN  COMPARISON:  Earlier same day  FINDINGS: Brain: There is cortical/subcortical reduced diffusion along the right precentral and postcentral gyri. There is no evidence of intracranial hemorrhage. There is no intracranial mass or significant mass effect. There is no hydrocephalus or extra-axial fluid collection. Patchy foci of T2 hyperintensity in the supratentorial white matter are nonspecific but probably reflect mild chronic microvascular ischemic changes. Ventricles and sulci are within normal limits in size and configuration. No abnormal enhancement.  Vascular: Major vessel flow voids at the skull base are preserved.  Skull and upper cervical spine: Normal marrow signal is preserved.  Sinuses/Orbits: Minor mucosal thickening.  Orbits are unremarkable.  Other: Sella is unremarkable.  Mastoid air cells are clear.  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  Statin:  Yes.   Beta Blocker:  No. Aspirin:  Yes.   ACEI:  No. ARB:  No. CCB use:  No Other antiplatelets/anticoagulants:  Yes.   Plavix   ASSESSMENT/PLAN: This is a 69 y.o. 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 Symptoms are presently resolved. Carotid duplex was ordered to further evaluate the instability of plaque and duplex is showing smooth heterogeneous plaque with 1-39%  ICA stenosis bilaterally. Continue dual antiplatelet therapy and Statin. Dr. Chestine Spore had an in depth conversation with family and patient regarding possible surgery versus medical management. He will follow with his plan later today after reviewing ultrasound and discussing patient with Neurology   Graceann Congress PA-C [facility] [phone] 11/05/2019  2:31 PM   I have seen and evaluated the patient. I agree with the PA note as documented above.  69 year old Hispanic male that presented for further work-up after he was noted to have left arm weakness last Thursday while driving.  MRI revealed small right brain stroke in frontoparietal region.  He is now neurologically recovered and is neuro intact.  He denies any previous TIA or strokes in the past.  His family is at bedside during evaluation.  CTA was obtained that did not show any significant stenosis in the right ICA but there was plaque with concern for high risk morphology.  By NASCET critiera only about 40% stenosis.  Ultimately a duplex was obtained today after we were consulted that shows only 1 to 39% stenosis in the right ICA.  I have reviewed his  imaging and I agree based on sagittal and coronal images it is a likely a ulcerated soft plaque here.  He has since been started on dual antiplatelet therapy (only on aspirin at home before event and turns out PCP started this after neurologic event on Thursday - was not even taking aspirin at time of event).  Also needs statin.  I discussed with him and his family that the typical indications are carotid intervention for more than 50% stenosis in the setting of symptomatic disease.  Given he was not on dual antiplatelet therapy would favor dual antiplatelet therapy for maximal medical management at this time.  I talked to Dr. Roda Shutters with the stroke service who agrees.  Certainly if he has a second event while on dual antiplatelet therapy will need to have a carotid intervention.  I will  arrange follow-up in 1 month in our clinic.  Cephus Shelling, MD [facility] [place] Office: [phone]

## 2019-11-05 NOTE — Progress Notes (Signed)
Carotid artery duplex completed. Refer to "CV Proc" under chart review to view preliminary results.  11/05/2019 12:05 PM Eula Fried., MHA, RVT, RDCS, RDMS

## 2019-11-05 NOTE — Progress Notes (Signed)
OT Screen Note  Patient Details Name: Govani Radloff MRN: [id number] DOB: [birth year]   Cancelled Treatment:    Reason Eval/Treat Not Completed: OT screened, no needs identified, will sign off. Spoke to [facility] and pt is at baseline currently with no balance deficits noted.  Wynona Neat, OTR/L  Acute Rehabilitation Services Pager: [phone] Office: [phone] .  11/05/2019, 12:48 PM

## 2019-11-05 NOTE — Progress Notes (Addendum)
STROKE TEAM PROGRESS NOTE   INTERVAL HISTORY Daughter-in-law at bedside, served as interpreter also.  Patient lying in bed, no acute distress, neurologically intact.  CT head and neck showed right ICA high risk plaque, vascular surgery consultation requested.  Vitals:   11/05/19 0356 11/05/19 0823 11/05/19 0853 11/05/19 1237  BP: 131/68 132/70 132/70 (!) 142/90  Pulse: 65 84 71 (!) 59  Resp: 19 17 18  (!) 23  Temp: 98.1 F (36.7 C) 97.6 F (36.4 C)  98.6 F (37 C)  TempSrc: Oral Oral  Oral  SpO2: 96% 98% 94% 93%  Weight:      Height:        CBC:  Recent Labs  Lab 11/04/19 1427 11/04/19 1446  WBC 7.3  --   NEUTROABS 4.8  --   HGB 17.1* 17.3*  HCT 52.0 51.0  MCV 90.6  --   PLT 235  --     Basic Metabolic Panel:  Recent Labs  Lab 11/04/19 1427 11/04/19 1427 11/04/19 1446 11/05/19 0442  NA 138   < > 140 141  K 5.2*   < > 5.2* 4.1  CL 99   < > 101 103  CO2 28  --   --  27  GLUCOSE 151*   < > 152* 108*  BUN 14   < > 17 13  CREATININE 0.85   < > 0.80 0.74  CALCIUM 9.2  --   --  8.6*   < > = values in this interval not displayed.   Lipid Panel:     Component Value Date/Time   CHOL 315 (H) 11/04/2019 2320   TRIG 387 (H) 11/04/2019 2320   HDL 41 11/04/2019 2320   CHOLHDL 7.7 11/04/2019 2320   VLDL 77 (H) 11/04/2019 2320   LDLCALC 197 (H) 11/04/2019 2320   HgbA1c:  Lab Results  Component Value Date   HGBA1C 6.6 (H) 11/04/2019   Urine Drug Screen: No results found for: LABOPIA, COCAINSCRNUR, LABBENZ, AMPHETMU, THCU, LABBARB  Alcohol Level No results found for: ETH  IMAGING past 24 hours CT ANGIO HEAD W OR WO CONTRAST  Result Date: 11/05/2019 CLINICAL DATA:  Launch template CTA head neck EXAM: CT ANGIOGRAPHY HEAD AND NECK TECHNIQUE: Multidetector CT imaging of the head and neck was performed using the standard protocol during bolus administration of intravenous contrast. Multiplanar CT image reconstructions and MIPs were obtained to evaluate the vascular  anatomy. Carotid stenosis measurements (when applicable) are obtained utilizing NASCET criteria, using the distal internal carotid diameter as the denominator. CONTRAST:  12mL OMNIPAQUE IOHEXOL 350 MG/ML SOLN COMPARISON:  None. FINDINGS: CT HEAD FINDINGS Brain: There is no mass, hemorrhage or extra-axial collection. The size and configuration of the ventricles and extra-axial CSF spaces are normal. There is no acute or chronic infarction. The brain parenchyma is normal. Skull: The visualized skull base, calvarium and extracranial soft tissues are normal. Sinuses/Orbits: No fluid levels or advanced mucosal thickening of the visualized paranasal sinuses. No mastoid or middle ear effusion. The orbits are normal. CTA NECK FINDINGS SKELETON: There is no bony spinal canal stenosis. No lytic or blastic lesion. OTHER NECK: Normal pharynx, larynx and major salivary glands. No cervical lymphadenopathy. Unremarkable thyroid gland. UPPER CHEST: No pneumothorax or pleural effusion. No nodules or masses. AORTIC ARCH: There is no calcific atherosclerosis of the aortic arch. There is no aneurysm, dissection or hemodynamically significant stenosis of the visualized portion of the aorta. Conventional 3 vessel aortic branching pattern. The visualized proximal subclavian arteries are  widely patent. RIGHT CAROTID SYSTEM: There is a moderate amount of irregular plaque within the proximal right internal carotid arteries. No hemodynamically significant stenosis. LEFT CAROTID SYSTEM: No dissection, occlusion or aneurysm. There is predominantly hypodense atherosclerosis extending into the proximal ICA, resulting in less than 50% stenosis. VERTEBRAL ARTERIES: Left dominant configuration. Both origins are clearly patent. There is no dissection, occlusion or flow-limiting stenosis to the skull base (V1-V3 segments). CTA HEAD FINDINGS POSTERIOR CIRCULATION: --Vertebral arteries: Normal V4 segments. --Posterior inferior cerebellar arteries  (PICA): Patent origins from the vertebral arteries. --Anterior inferior cerebellar arteries (AICA): Patent origins from the basilar artery. --Basilar artery: Normal. --Superior cerebellar arteries: Normal. --Posterior cerebral arteries: Normal. Both originate from the basilar artery. Posterior communicating arteries (p-comm) are diminutive or absent. ANTERIOR CIRCULATION: --Intracranial internal carotid arteries: Normal. --Anterior cerebral arteries (ACA): Normal. Both A1 segments are present. Patent anterior communicating artery (a-comm). --Middle cerebral arteries (MCA): Normal. VENOUS SINUSES: As permitted by contrast timing, patent. ANATOMIC VARIANTS: None Review of the MIP images confirms the above findings. IMPRESSION: 1. No emergent large vessel occlusion or hemodynamically significant stenosis. 2. Bilateral carotid bifurcation atherosclerosis without hemodynamically significant stenosis by NASCET criteria. Electronically Signed   By: Deatra Robinson M.D.   On: 11/05/2019 00:12   CT HEAD WO CONTRAST  Result Date: 11/04/2019 CLINICAL DATA:  Left facial droop and left arm numbness. EXAM: CT HEAD WITHOUT CONTRAST TECHNIQUE: Contiguous axial images were obtained from the base of the skull through the vertex without intravenous contrast. COMPARISON:  None. FINDINGS: Brain: No definite evidence of acute infarction, hemorrhage, hydrocephalus, extra-axial collection or mass lesion/mass effect. Possible 1 cm mixed echogenicity nodule at the gray white matter interface of the right frontal lobe Vascular: No hyperdense vessel or unexpected calcification. Skull: Normal. Negative for fracture or focal lesion. Sinuses/Orbits: Mild mucosal thickening of the ethmoid sinuses. Other: None. IMPRESSION: No definite evidence of acute infarction. However there is a nonspecific 1.7 cm nodule at the gray-white matter interface of the right frontal lobe with uncertain etiology. If clinically warranted, further evaluation with brain  MRI with contrast may be considered. Mild ethmoid sinusitis. Electronically Signed   By: Ted Mcalpine M.D.   On: 11/04/2019 16:01   CT ANGIO NECK W OR WO CONTRAST  Result Date: 11/05/2019 CLINICAL DATA:  Launch template CTA head neck EXAM: CT ANGIOGRAPHY HEAD AND NECK TECHNIQUE: Multidetector CT imaging of the head and neck was performed using the standard protocol during bolus administration of intravenous contrast. Multiplanar CT image reconstructions and MIPs were obtained to evaluate the vascular anatomy. Carotid stenosis measurements (when applicable) are obtained utilizing NASCET criteria, using the distal internal carotid diameter as the denominator. CONTRAST:  80mL OMNIPAQUE IOHEXOL 350 MG/ML SOLN COMPARISON:  None. FINDINGS: CT HEAD FINDINGS Brain: There is no mass, hemorrhage or extra-axial collection. The size and configuration of the ventricles and extra-axial CSF spaces are normal. There is no acute or chronic infarction. The brain parenchyma is normal. Skull: The visualized skull base, calvarium and extracranial soft tissues are normal. Sinuses/Orbits: No fluid levels or advanced mucosal thickening of the visualized paranasal sinuses. No mastoid or middle ear effusion. The orbits are normal. CTA NECK FINDINGS SKELETON: There is no bony spinal canal stenosis. No lytic or blastic lesion. OTHER NECK: Normal pharynx, larynx and major salivary glands. No cervical lymphadenopathy. Unremarkable thyroid gland. UPPER CHEST: No pneumothorax or pleural effusion. No nodules or masses. AORTIC ARCH: There is no calcific atherosclerosis of the aortic arch. There is no aneurysm, dissection or  hemodynamically significant stenosis of the visualized portion of the aorta. Conventional 3 vessel aortic branching pattern. The visualized proximal subclavian arteries are widely patent. RIGHT CAROTID SYSTEM: There is a moderate amount of irregular plaque within the proximal right internal carotid arteries. No  hemodynamically significant stenosis. LEFT CAROTID SYSTEM: No dissection, occlusion or aneurysm. There is predominantly hypodense atherosclerosis extending into the proximal ICA, resulting in less than 50% stenosis. VERTEBRAL ARTERIES: Left dominant configuration. Both origins are clearly patent. There is no dissection, occlusion or flow-limiting stenosis to the skull base (V1-V3 segments). CTA HEAD FINDINGS POSTERIOR CIRCULATION: --Vertebral arteries: Normal V4 segments. --Posterior inferior cerebellar arteries (PICA): Patent origins from the vertebral arteries. --Anterior inferior cerebellar arteries (AICA): Patent origins from the basilar artery. --Basilar artery: Normal. --Superior cerebellar arteries: Normal. --Posterior cerebral arteries: Normal. Both originate from the basilar artery. Posterior communicating arteries (p-comm) are diminutive or absent. ANTERIOR CIRCULATION: --Intracranial internal carotid arteries: Normal. --Anterior cerebral arteries (ACA): Normal. Both A1 segments are present. Patent anterior communicating artery (a-comm). --Middle cerebral arteries (MCA): Normal. VENOUS SINUSES: As permitted by contrast timing, patent. ANATOMIC VARIANTS: None Review of the MIP images confirms the above findings. IMPRESSION: 1. No emergent large vessel occlusion or hemodynamically significant stenosis. 2. Bilateral carotid bifurcation atherosclerosis without hemodynamically significant stenosis by NASCET criteria. Electronically Signed   By: Deatra Robinson M.D.   On: 11/05/2019 00:12   MR Brain W and Wo Contrast  Result Date: 11/04/2019 CLINICAL DATA:  Abnormal CT EXAM: MRI HEAD WITHOUT AND WITH CONTRAST TECHNIQUE: Multiplanar, multiecho pulse sequences of the brain and surrounding structures were obtained without and with intravenous contrast. CONTRAST:  7.62mL GADAVIST GADOBUTROL 1 MMOL/ML IV SOLN COMPARISON:  Earlier same day FINDINGS: Brain: There is cortical/subcortical reduced diffusion along the  right precentral and postcentral gyri. There is no evidence of intracranial hemorrhage. There is no intracranial mass or significant mass effect. There is no hydrocephalus or extra-axial fluid collection. Patchy foci of T2 hyperintensity in the supratentorial white matter are nonspecific but probably reflect mild chronic microvascular ischemic changes. Ventricles and sulci are within normal limits in size and configuration. No abnormal enhancement. Vascular: Major vessel flow voids at the skull base are preserved. Skull and upper cervical spine: Normal marrow signal is preserved. Sinuses/Orbits: Minor mucosal thickening.  Orbits are unremarkable. Other: Sella is unremarkable.  Mastoid air cells are clear. 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 Electronically Signed   By: Guadlupe Spanish M.D.   On: 11/04/2019 18:01   VAS US CAROTID  Result Date: 11/05/2019 Carotid Arterial Duplex Study Indications: Correlate with CTA neck. Performing Technologist: Gertie Fey MHA, RDMS, RVT, RDCS  Examination Guidelines: A complete evaluation includes B-mode imaging, spectral Doppler, color Doppler, and power Doppler as needed of all accessible portions of each vessel. Bilateral testing is considered an integral part of a complete examination. Limited examinations for reoccurring indications may be performed as noted.  Right Carotid Findings: +----------+--------+--------+--------+-----------------------+--------+           PSV cm/sEDV cm/sStenosisPlaque Description     Comments +----------+--------+--------+--------+-----------------------+--------+ CCA Prox  142     27              smooth and heterogenous         +----------+--------+--------+--------+-----------------------+--------+ CCA Distal65      15              smooth and heterogenous         +----------+--------+--------+--------+-----------------------+--------+ ICA Prox  122  32               smooth and heterogenous         +----------+--------+--------+--------+-----------------------+--------+ ICA Distal71      24                                              +----------+--------+--------+--------+-----------------------+--------+ ECA       112     18              smooth and heterogenous         +----------+--------+--------+--------+-----------------------+--------+ +----------+--------+-------+----------------+-------------------+           PSV cm/sEDV cmsDescribe        Arm Pressure (mmHG) +----------+--------+-------+----------------+-------------------+ [id number]            Multiphasic, WNL                    +----------+--------+-------+----------------+-------------------+ +---------+--------+--+--------+-+---------+ VertebralPSV cm/s28EDV cm/s5Antegrade +---------+--------+--+--------+-+---------+  Left Carotid Findings: +----------+--------+--------+--------+-----------------------+--------+           PSV cm/sEDV cm/sStenosisPlaque Description     Comments +----------+--------+--------+--------+-----------------------+--------+ CCA Prox  106     23                                              +----------+--------+--------+--------+-----------------------+--------+ CCA Distal93      21              smooth and heterogenous         +----------+--------+--------+--------+-----------------------+--------+ ICA Prox  79      20              smooth and heterogenous         +----------+--------+--------+--------+-----------------------+--------+ ICA Distal76      24                                              +----------+--------+--------+--------+-----------------------+--------+ ECA       130     20                                              +----------+--------+--------+--------+-----------------------+--------+ +----------+--------+--------+----------------+-------------------+           PSV cm/sEDV  cm/sDescribe        Arm Pressure (mmHG) +----------+--------+--------+----------------+-------------------+ [id number]             Multiphasic, WNL                    +----------+--------+--------+----------------+-------------------+ +---------+--------+--+--------+-+---------+ VertebralPSV cm/s39EDV cm/s8Antegrade +---------+--------+--+--------+-+---------+   Summary: Right Carotid: Velocities in the right ICA are consistent with an upper range                1-39% stenosis. Left Carotid: Velocities in the left ICA are consistent with a 1-39% stenosis. Vertebrals:  Bilateral vertebral arteries demonstrate antegrade flow. Subclavians: Normal flow hemodynamics were seen in bilateral subclavian              arteries. *See table(s) above for measurements and observations.     Preliminary  PHYSICAL EXAM  Temp:  [97.6 F (36.4 C)-98.6 F (37 C)] 98.6 F (37 C) (04/05 1237) Pulse Rate:  [59-84] 59 (04/05 1237) Resp:  [17-29] 23 (04/05 1237) BP: (120-161)/(64-90) 142/90 (04/05 1237) SpO2:  [93 %-98 %] 93 % (04/05 1237)  General - Well nourished, well developed, in no apparent distress.  Ophthalmologic - fundi not visualized due to noncooperation.  Cardiovascular - Regular rhythm and rate.  Mental Status -  Level of arousal and orientation to time, place, and person were intact. Language including expression, naming, repetition, comprehension was assessed and found intact. Fund of Knowledge was assessed and was intact.  Cranial Nerves II - XII - II - Visual field intact OU. III, IV, VI - Extraocular movements intact. V - Facial sensation intact bilaterally. VII - Facial movement intact bilaterally. VIII - Hearing & vestibular intact bilaterally. X - Palate elevates symmetrically. XI - Chin turning & shoulder shrug intact bilaterally. XII - Tongue protrusion intact.  Motor Strength - The patient's strength was normal in all extremities and pronator drift was absent.   Bulk was normal and fasciculations were absent.   Motor Tone - Muscle tone was assessed at the neck and appendages and was normal.  Reflexes - The patient's reflexes were symmetrical in all extremities and he had no pathological reflexes.  Sensory - Light touch, temperature/pinprick were assessed and were symmetrical.    Coordination - The patient had normal movements in the hands and feet with no ataxia or dysmetria.  Tremor was absent.  Gait and Station - deferred.  ASSESSMENT/PLAN Mr. Canary Brimusebio Ramirez-Martinez is a 69 y.o. male with history of seizures presenting with L sided weakness/numbness and difficulty speaking.   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 Nonspecific nodule R frontal lobe. Sinus dz.   MRI  Small R frontoparietal infarcts involving sensorimotor gyri  CTA head & neck no LVO. B ICA bifurcation atherosclerosis w/o stenosis. High risk soft plaques at right ICA although no significant stenosis   Carotid Doppler  B ICA 1-39% stenosis, VAs antegrade   2D Echo EF 60-65%  LDL 197  HgbA1c 6.6  Lovenox 40 mg sq daily for VTE prophylaxis  aspirin 81 mg daily prior to admission, now on aspirin 325 mg daily and clopidogrel 75 mg daily following plavix load. Continue DAPT x 3 months then plavix alone   Therapy recommendations:  No PT or OT needs  Disposition:  Return home  Carotid Stenosis  CTA neck R>L ICA stenoses with high risk soft plaque at right ICA although no significant stenosis  Carotid Doppler  B ICA 1-39% stenosis  VVS consulted - discussed with Dr. Chestine Sporelark - agree with maximize medical treatment at this time - close follow up with [facility]VS as outpt for monitoring  On DAPT for 3 months and then plavix alone  Elevated BP  Stable 140-160s . Permissive hypertension (OK if < 220/120) for 24-48 hours and then gradually normalized in 2-3 days . BP goal  normotensive  Hyperlipidemia  Home meds:  No statin  Now on lipitor 80  LDL 197, goal < 70  Continue statin at discharge  Diabetes type II Controlled, new dx  HgbA1c 6.6, goal < 7.0  CBGs  SSI  PCP follow up   May need DM coordinator for DM education  Other Stroke Risk Factors  Advanced age  Other Active Problems  Hx seizures on  phenobarb - continue  L side back pain w/ sciatica  Hyperkalemia 5.2->5.2->4.1  Hospital day # 0  Neurology will sign off. Please call with questions. Pt will follow up with stroke clinic NP at [facility] in about 4 weeks. Thanks for the consult.   Rosalin Hawking, MD PhD Stroke Neurology 11/05/2019 3:41 PM    To contact Stroke Continuity provider, please refer to [email or web address] After hours, contact General Neurology

## 2019-11-05 NOTE — Evaluation (Signed)
Physical Therapy Evaluation Patient Details Name: Randall Coffey MRN: [id number] DOB: 05-10-1951 Today's Date: 11/05/2019   History of Present Illness  Patient is a 69 y/o male who presents with facial droop and left UE weakness/numbness. MRI showing small acute infarcts in the right frontoparietal region; sensorimotor gyri. PMH includes seizures.  Clinical Impression  Patient lives with family, works, drives and independent PTA. Today, pt tolerated bed mobility, transfers, gait training and stairs with Min guard-supervision for safety. Tolerated higher level balance activities- head turns, picking up object off the floor, hopping over object etc with no LOB or difficulty. Reports feeling close to functional baseline. VSS. Encouraged walking with daughter daily while in the hospital. Education re: Be FAST. Pt does not require skilled therapy services as pt reports all deficits have resolved and pt is functioning close to baseline. Discharge from therapy.    Follow Up Recommendations No PT follow up;Supervision - Intermittent    Equipment Recommendations  None recommended by PT    Recommendations for Other Services       Precautions / Restrictions Precautions Precautions: None Restrictions Weight Bearing Restrictions: No      Mobility  Bed Mobility Overal bed mobility: Modified Independent             General bed mobility comments: HOB elevated, no assist needed.  Transfers Overall transfer level: Needs assistance Equipment used: None Transfers: Sit to/from Stand Sit to Stand: Min guard         General transfer comment: Min guard for safety, Stood from EOB x1, from toilet x1.  Ambulation/Gait Ambulation/Gait assistance: Supervision Gait Distance (Feet): 400 Feet Assistive device: None Gait Pattern/deviations: Step-through pattern;Decreased stride length   Gait velocity interpretation: >2.62 ft/sec, indicative of community ambulatory General Gait Details:  Steady, good paced gait. Tolerated higher level balance without difficulty. See balance section.  Stairs Stairs: Yes Stairs assistance: Supervision Stair Management: Alternating pattern;One rail Right Number of Stairs: 5(x2 bouts) General stair comments: Cues for safety.  Wheelchair Mobility    Modified Rankin (Stroke Patients Only) Modified Rankin (Stroke Patients Only) Pre-Morbid Rankin Score: No symptoms Modified Rankin: Slight disability     Balance Overall balance assessment: Needs assistance Sitting-balance support: Feet supported;No upper extremity supported Sitting balance-Leahy Scale: Good Sitting balance - Comments: supervision   Standing balance support: During functional activity Standing balance-Leahy Scale: Good Standing balance comment: supervision             High level balance activites: Head turns;Direction changes;Sudden stops;Turns High Level Balance Comments: Tolerated picking object up off the floor and hopping over square onf loor without LOB or difficulty.             Pertinent Vitals/Pain Pain Assessment: No/denies pain    Home Living Family/patient expects to be discharged to:: Private residence Living Arrangements: Other relatives Available Help at Discharge: Family;Available 24 hours/day Type of Home: House Home Access: Stairs to enter   [employer] of Steps: 1 little step up Home Layout: Two level Home Equipment: None      Prior Function Level of Independence: Independent         Comments: Working on floors, independent for ADLs, drives.     Hand Dominance   Dominant Hand: Right    Extremity/Trunk Assessment   Upper Extremity Assessment Upper Extremity Assessment: Defer to OT evaluation    Lower Extremity Assessment Lower Extremity Assessment: Overall WFL for tasks assessed(Sensation and coordination WFL)    Cervical / Trunk Assessment Cervical / Trunk Assessment: Normal  Communication    Communication: (pt prefers using daughter for translation)  Cognition Arousal/Alertness: Awake/alert Behavior During Therapy: WFL for tasks assessed/performed Overall Cognitive Status: Within Functional Limits for tasks assessed                                 General Comments: Appears WFL for basic mobility tasks. A&Ox4.      General Comments General comments (skin integrity, edema, etc.): Daughter present during session to assist with translation.    Exercises     Assessment/Plan    PT Assessment Patent does not need any further PT services  PT Problem List         PT Treatment Interventions      PT Goals (Current goals can be found in the Care Plan section)  Acute Rehab PT Goals Patient Stated Goal: to go home PT Goal Formulation: All assessment and education complete, DC therapy    Frequency     Barriers to discharge        Co-evaluation               AM-PAC PT "6 Clicks" Mobility  Outcome Measure Help needed turning from your back to your side while in a flat bed without using bedrails?: None Help needed moving from lying on your back to sitting on the side of a flat bed without using bedrails?: None Help needed moving to and from a bed to a chair (including a wheelchair)?: None Help needed standing up from a chair using your arms (e.g., wheelchair or bedside chair)?: None Help needed to walk in hospital room?: None Help needed climbing 3-5 steps with a railing? : A Little 6 Click Score: 23    End of Session Equipment Utilized During Treatment: Gait belt Activity Tolerance: Patient tolerated treatment well Patient left: in bed;with call bell/phone within reach;with family/visitor present Nurse Communication: Mobility status PT Visit Diagnosis: Difficulty in walking, not elsewhere classified (R26.2)    Time: 4401-0272 PT Time Calculation (min) (ACUTE ONLY): 28 min   Charges:   PT Evaluation $PT Eval Moderate Complexity: 1 Mod PT  Treatments $Neuromuscular Re-education: 8-22 mins        Marisa Severin, PT, DPT Acute Rehabilitation Services Pager [phone] Office [phone]      Randall Coffey 11/05/2019, 12:55 PM

## 2019-11-05 NOTE — Progress Notes (Addendum)
Family Medicine Teaching Service Daily Progress Note Intern Pager: [phone]  Patient name: Randall Coffey Medical record number: [id number] Date of birth: [birth year] Age: 69 y.o. 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  Patient reports that previously had some weakness in her right upper extremity.  On exam today patient has 5/5 strength in bilateral upper and lower extremities.  Normal sensation throughout.  Did not appreciate facial droop today. Labs show elevated total cholesterol 315, triglycerides elevated at 387 and LDL elevated at 197.  Patient with hemoglobin A1c elevated at 6.6.  Imaging is consistent with bilateral carotid bifurcation atherosclerosis with no hemodynamic significance, however, high risk plaque noted. Neurology recommends vascular surgery consult.   -Follow-up echocardiogram -Neurology following, recommends vascular surgery consult for high risk atherosclerosis - vascular surgery consulted   -Continue aspirin 81 mg -Continue Plavix 75 mg -Continue atorvastatin 80 mg -Follow recs from PT/OT -speech evaluation passed, SLP signed off  -permissive HTN for up to 48 hours, BP within normal limits  Elevated BP, improved  BP 120-161 overnight. This AM 131/68  -continue to monirotr with VS -allow for permissive HTN   Hx of Seizures Patient's daughter-in-law states that she is unsure of the location for patient's NP who prescribes AED but is under the impression that the patient sees this provider. No seizures  reported in 8 years. Phenobarbital subtherapeutic at 11.6, goal 15.  - continue home medication of phenobarbital 100 mg daily - will recommend outpatient follow up for dose adjustments   Elevated blood glucose, improved Patient with current hemoglobin A1c of 6.6. Also increased risk for stroke and other CV disease. Recommend patient for PCP follow up as outpatient management.  Patient with 151 blood glucose on admission, today blood glucose 108. -Consider starting Metformin as outpatient with PCP  -recommend rechecking hemoglobin A1c at discharge    Left side Back pain with sciatica Chronic. Stable.  He takes Ibuprofen or Tylenol for pain as needed -Continue to monitor -Tylenol prn for pain   Hyperkalemia Unclear etiology.  Not taking any potassium sparing medications.  EKG NSR and no complaints of chest pain.  -Continue to monitor -BMP pending    FEN/GI: heart healthy diet  PPx: lovenox  Disposition: discharge home pending completion of evaluation for CVA   Subjective:  Patient has no new complaints today.  Patient denies headache, chest pain, blurry vision.  Objective: Temp:  [97.6 F (36.4 C)-98.6 F (37 C)] 97.6 F (36.4 C) (04/05 0823) Pulse Rate:  [58-84] 71 (04/05 0853) Resp:  [17-30] 18 (04/05 0853) BP: (120-161)/(64-85) 132/70 (04/05 0853) SpO2:  [94 %-98 %] 94 % (04/05 0853) Weight:  [74.4 kg] 74.4 kg (04/04 1519)  Physical Exam: General: Male appearing stated age sitting up in bed in no acute distress Cardiovascular: Regular rate and rhythm without murmurs Respiratory: Clear to auscultation bilaterally, normal work of breathing Abdomen: Soft, nontender, bowel sounds present throughout Extremities: Moves all extremities with normal range of motion  Neuro: 5/5 strength in bilateral upper and lower extremities, sensation equal and bilateral upper and lower extremities, no facial asymmetry appreciated, cranial nerves II through XII appear  to be intact  bilaterally, extraocular muscles movements intact bilaterally, pupils equal and reactive to light bilaterally  Laboratory: Recent Labs  Lab 11/04/19 1427 11/04/19 1446  WBC 7.3  --   HGB 17.1* 17.3*  HCT 52.0 51.0  PLT 235  --    Recent Labs  Lab 11/04/19 1427 11/04/19 1446 11/05/19 0442  NA 138 140 141  K 5.2* 5.2* 4.1  CL 99 101 103  CO2 28  --  27  BUN 14 17 13   CREATININE 0.85 0.80 0.74  CALCIUM 9.2  --  8.6*  PROT 7.5  --   --   BILITOT 0.4  --   --   ALKPHOS 83  --   --   ALT 29  --   --   AST 24  --   --   GLUCOSE 151* 152* 108*     Imaging/Diagnostic Tests: CT ANGIO HEAD W OR WO CONTRAST  Result Date: 11/05/2019 CLINICAL DATA:  Launch template CTA head neck EXAM: CT ANGIOGRAPHY HEAD AND NECK TECHNIQUE: Multidetector CT imaging of the head and neck was performed using the standard protocol during bolus administration of intravenous contrast. Multiplanar CT image reconstructions and MIPs were obtained to evaluate the vascular anatomy. Carotid stenosis measurements (when applicable) are obtained utilizing NASCET criteria, using the distal internal carotid diameter as the denominator. CONTRAST:  85mL OMNIPAQUE IOHEXOL 350 MG/ML SOLN COMPARISON:  None. FINDINGS: CT HEAD FINDINGS Brain: There is no mass, hemorrhage or extra-axial collection. The size and configuration of the ventricles and extra-axial CSF spaces are normal. There is no acute or chronic infarction. The brain parenchyma is normal. Skull: The visualized skull base, calvarium and extracranial soft tissues are normal. Sinuses/Orbits: No fluid levels or advanced mucosal thickening of the visualized paranasal sinuses. No mastoid or middle ear effusion. The orbits are normal. CTA NECK FINDINGS SKELETON: There is no bony spinal canal stenosis. No lytic or blastic lesion. OTHER NECK: Normal pharynx, larynx and major salivary glands. No cervical lymphadenopathy. Unremarkable thyroid gland. UPPER CHEST: No pneumothorax or  pleural effusion. No nodules or masses. AORTIC ARCH: There is no calcific atherosclerosis of the aortic arch. There is no aneurysm, dissection or hemodynamically significant stenosis of the visualized portion of the aorta. Conventional 3 vessel aortic branching pattern. The visualized proximal subclavian arteries are widely patent. RIGHT CAROTID SYSTEM: There is a moderate amount of irregular plaque within the proximal right internal carotid arteries. No hemodynamically significant stenosis. LEFT CAROTID SYSTEM: No dissection, occlusion or aneurysm. There is predominantly hypodense atherosclerosis extending into the proximal ICA, resulting in less than 50% stenosis. VERTEBRAL ARTERIES: Left dominant configuration. Both origins are clearly patent. There is no dissection, occlusion or flow-limiting stenosis to the skull base (V1-V3 segments). CTA HEAD FINDINGS POSTERIOR CIRCULATION: --Vertebral arteries: Normal V4 segments. --Posterior inferior cerebellar arteries (PICA): Patent origins from the vertebral arteries. --Anterior inferior cerebellar arteries (AICA): Patent origins from the basilar artery. --Basilar artery: Normal. --Superior cerebellar arteries: Normal. --Posterior cerebral arteries: Normal. Both originate from the basilar artery. Posterior communicating arteries (p-comm) are diminutive or absent. ANTERIOR CIRCULATION: --Intracranial internal carotid arteries: Normal. --Anterior cerebral arteries (ACA): Normal. Both A1 segments are present. Patent anterior communicating artery (a-comm). --Middle cerebral arteries (MCA): Normal. VENOUS SINUSES: As permitted by contrast timing, patent. ANATOMIC VARIANTS: None Review of the MIP images confirms the above findings. IMPRESSION: 1. No emergent large vessel occlusion or hemodynamically significant stenosis. 2. Bilateral carotid bifurcation atherosclerosis without hemodynamically significant stenosis by NASCET criteria. Electronically Signed  By: Deatra Robinson  M.D.   On: 11/05/2019 00:12   CT HEAD WO CONTRAST  Result Date: 11/04/2019 CLINICAL DATA:  Left facial droop and left arm numbness. EXAM: CT HEAD WITHOUT CONTRAST TECHNIQUE: Contiguous axial images were obtained from the base of the skull through the vertex without intravenous contrast. COMPARISON:  None. FINDINGS: Brain: No definite evidence of acute infarction, hemorrhage, hydrocephalus, extra-axial collection or mass lesion/mass effect. Possible 1 cm mixed echogenicity nodule at the gray white matter interface of the right frontal lobe Vascular: No hyperdense vessel or unexpected calcification. Skull: Normal. Negative for fracture or focal lesion. Sinuses/Orbits: Mild mucosal thickening of the ethmoid sinuses. Other: None. IMPRESSION: No definite evidence of acute infarction. However there is a nonspecific 1.7 cm nodule at the gray-white matter interface of the right frontal lobe with uncertain etiology. If clinically warranted, further evaluation with brain MRI with contrast may be considered. Mild ethmoid sinusitis. Electronically Signed   By: Ted Mcalpine M.D.   On: 11/04/2019 16:01   CT ANGIO NECK W OR WO CONTRAST  Result Date: 11/05/2019 CLINICAL DATA:  Launch template CTA head neck EXAM: CT ANGIOGRAPHY HEAD AND NECK TECHNIQUE: Multidetector CT imaging of the head and neck was performed using the standard protocol during bolus administration of intravenous contrast. Multiplanar CT image reconstructions and MIPs were obtained to evaluate the vascular anatomy. Carotid stenosis measurements (when applicable) are obtained utilizing NASCET criteria, using the distal internal carotid diameter as the denominator. CONTRAST:  2mL OMNIPAQUE IOHEXOL 350 MG/ML SOLN COMPARISON:  None. FINDINGS: CT HEAD FINDINGS Brain: There is no mass, hemorrhage or extra-axial collection. The size and configuration of the ventricles and extra-axial CSF spaces are normal. There is no acute or chronic infarction. The brain  parenchyma is normal. Skull: The visualized skull base, calvarium and extracranial soft tissues are normal. Sinuses/Orbits: No fluid levels or advanced mucosal thickening of the visualized paranasal sinuses. No mastoid or middle ear effusion. The orbits are normal. CTA NECK FINDINGS SKELETON: There is no bony spinal canal stenosis. No lytic or blastic lesion. OTHER NECK: Normal pharynx, larynx and major salivary glands. No cervical lymphadenopathy. Unremarkable thyroid gland. UPPER CHEST: No pneumothorax or pleural effusion. No nodules or masses. AORTIC ARCH: There is no calcific atherosclerosis of the aortic arch. There is no aneurysm, dissection or hemodynamically significant stenosis of the visualized portion of the aorta. Conventional 3 vessel aortic branching pattern. The visualized proximal subclavian arteries are widely patent. RIGHT CAROTID SYSTEM: There is a moderate amount of irregular plaque within the proximal right internal carotid arteries. No hemodynamically significant stenosis. LEFT CAROTID SYSTEM: No dissection, occlusion or aneurysm. There is predominantly hypodense atherosclerosis extending into the proximal ICA, resulting in less than 50% stenosis. VERTEBRAL ARTERIES: Left dominant configuration. Both origins are clearly patent. There is no dissection, occlusion or flow-limiting stenosis to the skull base (V1-V3 segments). CTA HEAD FINDINGS POSTERIOR CIRCULATION: --Vertebral arteries: Normal V4 segments. --Posterior inferior cerebellar arteries (PICA): Patent origins from the vertebral arteries. --Anterior inferior cerebellar arteries (AICA): Patent origins from the basilar artery. --Basilar artery: Normal. --Superior cerebellar arteries: Normal. --Posterior cerebral arteries: Normal. Both originate from the basilar artery. Posterior communicating arteries (p-comm) are diminutive or absent. ANTERIOR CIRCULATION: --Intracranial internal carotid arteries: Normal. --Anterior cerebral arteries  (ACA): Normal. Both A1 segments are present. Patent anterior communicating artery (a-comm). --Middle cerebral arteries (MCA): Normal. VENOUS SINUSES: As permitted by contrast timing, patent. ANATOMIC VARIANTS: None Review of the MIP images confirms the above findings. IMPRESSION: 1. No emergent  large vessel occlusion or hemodynamically significant stenosis. 2. Bilateral carotid bifurcation atherosclerosis without hemodynamically significant stenosis by NASCET criteria. Electronically Signed   By: Ulyses Jarred M.D.   On: 11/05/2019 00:12   MR Brain W and Wo Contrast  Result Date: 11/04/2019 CLINICAL DATA:  Abnormal CT EXAM: MRI HEAD WITHOUT AND WITH CONTRAST TECHNIQUE: Multiplanar, multiecho pulse sequences of the brain and surrounding structures were obtained without and with intravenous contrast. CONTRAST:  7.32mL GADAVIST GADOBUTROL 1 MMOL/ML IV SOLN COMPARISON:  Earlier same day FINDINGS: Brain: There is cortical/subcortical reduced diffusion along the right precentral and postcentral gyri. There is no evidence of intracranial hemorrhage. There is no intracranial mass or significant mass effect. There is no hydrocephalus or extra-axial fluid collection. Patchy foci of T2 hyperintensity in the supratentorial white matter are nonspecific but probably reflect mild chronic microvascular ischemic changes. Ventricles and sulci are within normal limits in size and configuration. No abnormal enhancement. Vascular: Major vessel flow voids at the skull base are preserved. Skull and upper cervical spine: Normal marrow signal is preserved. Sinuses/Orbits: Minor mucosal thickening.  Orbits are unremarkable. Other: Sella is unremarkable.  Mastoid air cells are clear. 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 Electronically Signed   By: Macy Mis M.D.   On: 11/04/2019 18:01   Stark Klein, MD 11/05/2019, 11:57 AM PGY-1, [facility] Intern  pager: [phone], text pages welcome

## 2019-11-05 NOTE — Progress Notes (Signed)
  Echocardiogram 2D Echocardiogram has been performed.  Delcie Roch 11/05/2019, 12:27 PM

## 2019-11-05 NOTE — Hospital Course (Addendum)
Randall Coffey is a 69 y.o. male with past medical history significant for seizures (phenobarbital) who presented to the ED with Left sided numbness and facial droop.  CVA Patient presented to the ED after an episode of Left sided arm numbness and facial drooping.  He had two similar episodes within the last two weeks that spontaneously resolved.  In the ED, he was hypertensive with BP of 156/63.  Neurology was consulted. CT head was negative for acute infarct but a nonspecific 1.7cm nodule was noted int the frontal lobe.  MRI brain impressive for small right frontoparietal infarcts involving sensory gyri.  CT head and neck showed no significant stenosis although concern for high risk soft plaques at the Right ICA so vascular surgery consult was placed and it was determined no intervention was necessary at this time.  ECHO was completed and showed an EF of 60-65%. Risk factor work up included Lipid panel and HbA1c.  LDL 197 and started on high intensity statin, DAPT ASA 325 mg and Plavix 75 mg daily x 3 months then Plavix alone. His symptoms continued to improve and at the time of discharge he had returned to baseline and had normal ROM in bilateral upper and lower extremities with intact sensation and mentation throughout. Patient declined spanish interpreter services and requested that family members translate for him instead.   Carotid Stenosis Given stroke symptoms, carotid dopplers obtained and showed high risk plaques at the Rt ICA without significant stenosis. Vascular surgery consulted and recommends medical management at this time but close outpatient follow up with [facility].    HTN SBP 120's-150.  No history of HTN.  Permissive HTN 220/110 was allowed for 24-48 hrs.  He required no hypertensive medications during admission.  His BP was 143/81 on discharge with no report of HA, blurry vision or chest pain.  He will need close BP monitoring and management.Patient demonstrated normal  mentation.

## 2019-11-05 NOTE — Progress Notes (Signed)
SLP Cancellation Note  Patient Details Name: Randall Coffey MRN: [id number] DOB: 12/09/1950   Cancelled treatment:       Reason Eval/Treat Not Completed: Other (comment) Order received for swallow evaluation, although note that pt has passed the [facility] swallow screen and has been started on a PO diet. Per protocol, SLP will defer our swallow evaluation. Please reorder if there are any concerns on current diet. Otherwise, SLP will f/u as able for completion of speech/language evaluation.    Mahala Menghini., M.A. CCC-SLP Acute Rehabilitation Services Pager [phone] Office [phone]  11/05/2019, 7:35 AM

## 2019-11-05 NOTE — Discharge Summary (Signed)
[facility] Discharge Summary  Patient name: Randall Coffey Medical record number: [id number] Date of birth: Jan 06, 1951 Age: 69 y.o. Gender: male Date of Admission: 11/04/2019  Date of Discharge: 11/06/19    Admitting Physician: Carollee Leitz, MD  Primary Care Provider: Patient, No Pcp Per Consultants: Neurology, Vascular Surgery   Indication for Hospitalization: left sided weakness   Discharge Diagnoses/Problem List:  Active Problems:   Stroke-like symptoms   Acute CVA (cerebrovascular accident) [facility])   Disposition: home   Discharge Condition: improved and stable   Discharge Exam:  BP (!) 143/81   Pulse 65   Temp 98.3 F (36.8 C) (Oral)   Resp 18   Ht 5\' 6"  (1.676 m)   Wt 74.4 kg   SpO2 98%   BMI 26.47 kg/m   General: Alert and cooperative and appears to be in no acute distress, daughter in law served as interpreter for today's encounter  HEENT: MMM, EOMI bilaterally without conjunctival injection nor scleral icterus  Cardio: Normal S1 and S2, no S3 or S4. Rhythm is regular. No murmurs or rubs.   Pulm: Clear to auscultation bilaterally, no crackles, wheezing, or diminished breath sounds. Normal respiratory effort Abdomen: Bowel sounds normal. Abdomen soft and non-tender.  Extremities: No peripheral edema. Warm/ well perfused. Neuro: Cranial nerves grossly intact bilaterally, normal sensation throughout, 5/5 strength in bilateral upper and lower extremities    Brief Hospital Course:  Randall Coffey is a 69 y.o. male with past medical history significant for seizures (phenobarbital) who presented to the ED with Left sided numbness and facial droop.  CVA Patient presented to the ED after an episode of Left sided arm numbness and facial drooping.  He had two similar episodes within the last two weeks that spontaneously resolved.  In the ED, he was hypertensive with BP of 156/63.  Neurology was consulted. CT head was negative for  acute infarct but a nonspecific 1.7cm nodule was noted int the frontal lobe.  MRI brain impressive for small right frontoparietal infarcts involving sensory gyri.  CT head and neck showed no significant stenosis although concern for high risk soft plaques at the Right ICA so vascular surgery consult was placed and it was determined no intervention was necessary at this time.  ECHO was completed and showed an EF of 60-65%. Risk factor work up included Lipid panel and HbA1c.  LDL 197 and started on high intensity statin, DAPT ASA 325 mg and Plavix 75 mg daily x 3 months then Plavix alone. His symptoms continued to improve and at the time of discharge he had returned to baseline and had normal ROM in bilateral upper and lower extremities with intact sensation and mentation throughout. Patient declined spanish interpreter services and requested that family members translate for him instead.   Carotid Stenosis Given stroke symptoms, carotid dopplers obtained and showed high risk plaques at the Rt ICA without significant stenosis. Vascular surgery consulted and recommends medical management at this time but close outpatient follow up with [facility].    HTN SBP 120's-150.  No history of HTN.  Permissive HTN 220/110 was allowed for 24-48 hrs.  He required no hypertensive medications during admission.  His BP was 143/81 on discharge with no report of HA, blurry vision or chest pain.  He will need close BP monitoring and management.Patient demonstrated normal mentation.    Issues for Follow Up:  1. Hemoglobin A1C elevation, recommend starting Metformin as outpatient and rechecking Hemoglobin A1c.  2. Hypertension management - blood pressure  was elevated throughout admission; patient may benefit from anti-hypertensive medication.   Significant Procedures:   Procedure Orders     Critical Care     ED EKG     ECHOCARDIOGRAM COMPLETE  Significant Labs and Imaging:  Recent Labs  Lab 11/04/19 1427 11/04/19 1446  11/06/19 0606  WBC 7.3  --  6.5  HGB 17.1* 17.3* 16.7  HCT 52.0 51.0 50.4  PLT 235  --  220   Recent Labs  Lab 11/04/19 1427 11/04/19 1427 11/04/19 1446 11/04/19 1446 11/05/19 0442 11/06/19 0606  NA 138  --  140  --  141 141  K 5.2*   < > 5.2*   < > 4.1 4.2  CL 99  --  101  --  103 103  CO2 28  --   --   --  27 26  GLUCOSE 151*  --  152*  --  108* 117*  BUN 14  --  17  --  13 18  CREATININE 0.85  --  0.80  --  0.74 0.83  CALCIUM 9.2  --   --   --  8.6* 8.7*  ALKPHOS 83  --   --   --   --   --   AST 24  --   --   --   --   --   ALT 29  --   --   --   --   --   ALBUMIN 3.8  --   --   --   --   --    < > = values in this interval not displayed.    CT ANGIO HEAD W OR WO CONTRAST  Result Date: 11/05/2019 CLINICAL DATA:  Launch template CTA head neck EXAM: CT ANGIOGRAPHY HEAD AND NECK TECHNIQUE: Multidetector CT imaging of the head and neck was performed using the standard protocol during bolus administration of intravenous contrast. Multiplanar CT image reconstructions and MIPs were obtained to evaluate the vascular anatomy. Carotid stenosis measurements (when applicable) are obtained utilizing NASCET criteria, using the distal internal carotid diameter as the denominator. CONTRAST:  80mL OMNIPAQUE IOHEXOL 350 MG/ML SOLN COMPARISON:  None. FINDINGS: CT HEAD FINDINGS Brain: There is no mass, hemorrhage or extra-axial collection. The size and configuration of the ventricles and extra-axial CSF spaces are normal. There is no acute or chronic infarction. The brain parenchyma is normal. Skull: The visualized skull base, calvarium and extracranial soft tissues are normal. Sinuses/Orbits: No fluid levels or advanced mucosal thickening of the visualized paranasal sinuses. No mastoid or middle ear effusion. The orbits are normal. CTA NECK FINDINGS SKELETON: There is no bony spinal canal stenosis. No lytic or blastic lesion. OTHER NECK: Normal pharynx, larynx and major salivary glands. No cervical  lymphadenopathy. Unremarkable thyroid gland. UPPER CHEST: No pneumothorax or pleural effusion. No nodules or masses. AORTIC ARCH: There is no calcific atherosclerosis of the aortic arch. There is no aneurysm, dissection or hemodynamically significant stenosis of the visualized portion of the aorta. Conventional 3 vessel aortic branching pattern. The visualized proximal subclavian arteries are widely patent. RIGHT CAROTID SYSTEM: There is a moderate amount of irregular plaque within the proximal right internal carotid arteries. No hemodynamically significant stenosis. LEFT CAROTID SYSTEM: No dissection, occlusion or aneurysm. There is predominantly hypodense atherosclerosis extending into the proximal ICA, resulting in less than 50% stenosis. VERTEBRAL ARTERIES: Left dominant configuration. Both origins are clearly patent. There is no dissection, occlusion or flow-limiting stenosis to the skull base (V1-V3 segments). CTA  HEAD FINDINGS POSTERIOR CIRCULATION: --Vertebral arteries: Normal V4 segments. --Posterior inferior cerebellar arteries (PICA): Patent origins from the vertebral arteries. --Anterior inferior cerebellar arteries (AICA): Patent origins from the basilar artery. --Basilar artery: Normal. --Superior cerebellar arteries: Normal. --Posterior cerebral arteries: Normal. Both originate from the basilar artery. Posterior communicating arteries (p-comm) are diminutive or absent. ANTERIOR CIRCULATION: --Intracranial internal carotid arteries: Normal. --Anterior cerebral arteries (ACA): Normal. Both A1 segments are present. Patent anterior communicating artery (a-comm). --Middle cerebral arteries (MCA): Normal. VENOUS SINUSES: As permitted by contrast timing, patent. ANATOMIC VARIANTS: None Review of the MIP images confirms the above findings. IMPRESSION: 1. No emergent large vessel occlusion or hemodynamically significant stenosis. 2. Bilateral carotid bifurcation atherosclerosis without hemodynamically  significant stenosis by NASCET criteria. Electronically Signed   By: Deatra Robinson M.D.   On: 11/05/2019 00:12   CT HEAD WO CONTRAST  Result Date: 11/04/2019 CLINICAL DATA:  Left facial droop and left arm numbness. EXAM: CT HEAD WITHOUT CONTRAST TECHNIQUE: Contiguous axial images were obtained from the base of the skull through the vertex without intravenous contrast. COMPARISON:  None. FINDINGS: Brain: No definite evidence of acute infarction, hemorrhage, hydrocephalus, extra-axial collection or mass lesion/mass effect. Possible 1 cm mixed echogenicity nodule at the gray white matter interface of the right frontal lobe Vascular: No hyperdense vessel or unexpected calcification. Skull: Normal. Negative for fracture or focal lesion. Sinuses/Orbits: Mild mucosal thickening of the ethmoid sinuses. Other: None. IMPRESSION: No definite evidence of acute infarction. However there is a nonspecific 1.7 cm nodule at the gray-white matter interface of the right frontal lobe with uncertain etiology. If clinically warranted, further evaluation with brain MRI with contrast may be considered. Mild ethmoid sinusitis. Electronically Signed   By: Ted Mcalpine M.D.   On: 11/04/2019 16:01   CT ANGIO NECK W OR WO CONTRAST  Result Date: 11/05/2019 CLINICAL DATA:  Launch template CTA head neck EXAM: CT ANGIOGRAPHY HEAD AND NECK TECHNIQUE: Multidetector CT imaging of the head and neck was performed using the standard protocol during bolus administration of intravenous contrast. Multiplanar CT image reconstructions and MIPs were obtained to evaluate the vascular anatomy. Carotid stenosis measurements (when applicable) are obtained utilizing NASCET criteria, using the distal internal carotid diameter as the denominator. CONTRAST:  80mL OMNIPAQUE IOHEXOL 350 MG/ML SOLN COMPARISON:  None. FINDINGS: CT HEAD FINDINGS Brain: There is no mass, hemorrhage or extra-axial collection. The size and configuration of the ventricles and  extra-axial CSF spaces are normal. There is no acute or chronic infarction. The brain parenchyma is normal. Skull: The visualized skull base, calvarium and extracranial soft tissues are normal. Sinuses/Orbits: No fluid levels or advanced mucosal thickening of the visualized paranasal sinuses. No mastoid or middle ear effusion. The orbits are normal. CTA NECK FINDINGS SKELETON: There is no bony spinal canal stenosis. No lytic or blastic lesion. OTHER NECK: Normal pharynx, larynx and major salivary glands. No cervical lymphadenopathy. Unremarkable thyroid gland. UPPER CHEST: No pneumothorax or pleural effusion. No nodules or masses. AORTIC ARCH: There is no calcific atherosclerosis of the aortic arch. There is no aneurysm, dissection or hemodynamically significant stenosis of the visualized portion of the aorta. Conventional 3 vessel aortic branching pattern. The visualized proximal subclavian arteries are widely patent. RIGHT CAROTID SYSTEM: There is a moderate amount of irregular plaque within the proximal right internal carotid arteries. No hemodynamically significant stenosis. LEFT CAROTID SYSTEM: No dissection, occlusion or aneurysm. There is predominantly hypodense atherosclerosis extending into the proximal ICA, resulting in less than 50% stenosis. VERTEBRAL ARTERIES: Left  dominant configuration. Both origins are clearly patent. There is no dissection, occlusion or flow-limiting stenosis to the skull base (V1-V3 segments). CTA HEAD FINDINGS POSTERIOR CIRCULATION: --Vertebral arteries: Normal V4 segments. --Posterior inferior cerebellar arteries (PICA): Patent origins from the vertebral arteries. --Anterior inferior cerebellar arteries (AICA): Patent origins from the basilar artery. --Basilar artery: Normal. --Superior cerebellar arteries: Normal. --Posterior cerebral arteries: Normal. Both originate from the basilar artery. Posterior communicating arteries (p-comm) are diminutive or absent. ANTERIOR  CIRCULATION: --Intracranial internal carotid arteries: Normal. --Anterior cerebral arteries (ACA): Normal. Both A1 segments are present. Patent anterior communicating artery (a-comm). --Middle cerebral arteries (MCA): Normal. VENOUS SINUSES: As permitted by contrast timing, patent. ANATOMIC VARIANTS: None Review of the MIP images confirms the above findings. IMPRESSION: 1. No emergent large vessel occlusion or hemodynamically significant stenosis. 2. Bilateral carotid bifurcation atherosclerosis without hemodynamically significant stenosis by NASCET criteria. Electronically Signed   By: Deatra Robinson M.D.   On: 11/05/2019 00:12   MR Brain W and Wo Contrast  Result Date: 11/04/2019 CLINICAL DATA:  Abnormal CT EXAM: MRI HEAD WITHOUT AND WITH CONTRAST TECHNIQUE: Multiplanar, multiecho pulse sequences of the brain and surrounding structures were obtained without and with intravenous contrast. CONTRAST:  7.22mL GADAVIST GADOBUTROL 1 MMOL/ML IV SOLN COMPARISON:  Earlier same day FINDINGS: Brain: There is cortical/subcortical reduced diffusion along the right precentral and postcentral gyri. There is no evidence of intracranial hemorrhage. There is no intracranial mass or significant mass effect. There is no hydrocephalus or extra-axial fluid collection. Patchy foci of T2 hyperintensity in the supratentorial white matter are nonspecific but probably reflect mild chronic microvascular ischemic changes. Ventricles and sulci are within normal limits in size and configuration. No abnormal enhancement. Vascular: Major vessel flow voids at the skull base are preserved. Skull and upper cervical spine: Normal marrow signal is preserved. Sinuses/Orbits: Minor mucosal thickening.  Orbits are unremarkable. Other: Sella is unremarkable.  Mastoid air cells are clear. 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 Electronically Signed   By: Guadlupe Spanish M.D.   On: 11/04/2019 18:01    ECHOCARDIOGRAM COMPLETE  Result Date: 11/05/2019    ECHOCARDIOGRAM REPORT   Patient Name:   ISAISH ALEMU Date of Exam: 11/05/2019 Medical Rec #:  [id number]                Height:       66.0 in Accession #:    [id number]               Weight:       164.0 lb Date of Birth:  [birth year]                 BSA:          1.838 m Patient Age:    69 years                 BP:           132/70 mmHg Patient Gender: M                        HR:           68 bpm. Exam Location:  Inpatient Procedure: 2D Echo Indications:    stroke 434.91  History:        Patient has no prior history of Echocardiogram examinations.  Sonographer:    Delcie Roch Referring Phys: [id number] MARSHALL L CHAMBLISS IMPRESSIONS  1. Left ventricular ejection fraction, by estimation, is  60 to 65%. The left ventricle has normal function. The left ventricle has no regional wall motion abnormalities. Left ventricular diastolic parameters are consistent with Grade I diastolic dysfunction (impaired relaxation).  2. Right ventricular systolic function is normal. The right ventricular size is normal. Tricuspid regurgitation signal is inadequate for assessing PA pressure.  3. The mitral valve is grossly normal. No evidence of mitral valve regurgitation. No evidence of mitral stenosis.  4. The aortic valve is tricuspid. Aortic valve regurgitation is not visualized. No aortic stenosis is present.  5. The inferior vena cava is normal in size with greater than 50% respiratory variability, suggesting right atrial pressure of 3 mmHg. Conclusion(s)/Recommendation(s): No intracardiac source of embolism detected on this transthoracic study. A transesophageal echocardiogram is recommended to exclude cardiac source of embolism if clinically indicated. FINDINGS  Left Ventricle: Left ventricular ejection fraction, by estimation, is 60 to 65%. The left ventricle has normal function. The left ventricle has no regional wall motion abnormalities. The left ventricular  internal cavity size was normal in size. There is  no left ventricular hypertrophy. Left ventricular diastolic parameters are consistent with Grade I diastolic dysfunction (impaired relaxation). Right Ventricle: The right ventricular size is normal. No increase in right ventricular wall thickness. Right ventricular systolic function is normal. Tricuspid regurgitation signal is inadequate for assessing PA pressure. Left Atrium: Left atrial size was normal in size. Right Atrium: Right atrial size was normal in size. Pericardium: There is no evidence of pericardial effusion. Presence of pericardial fat pad. Mitral Valve: The mitral valve is grossly normal. No evidence of mitral valve regurgitation. No evidence of mitral valve stenosis. Tricuspid Valve: The tricuspid valve is grossly normal. Tricuspid valve regurgitation is not demonstrated. No evidence of tricuspid stenosis. Aortic Valve: The aortic valve is tricuspid. Aortic valve regurgitation is not visualized. No aortic stenosis is present. Pulmonic Valve: The pulmonic valve was grossly normal. Pulmonic valve regurgitation is not visualized. No evidence of pulmonic stenosis. Aorta: The aortic root is normal in size and structure. Venous: The inferior vena cava is normal in size with greater than 50% respiratory variability, suggesting right atrial pressure of 3 mmHg. IAS/Shunts: The atrial septum is grossly normal.  LEFT VENTRICLE PLAX 2D LVIDd:         4.80 cm  Diastology LVIDs:         3.20 cm  LV e' lateral:   7.51 cm/s LV PW:         1.00 cm  LV E/e' lateral: 6.3 LV IVS:        1.00 cm  LV e' medial:    5.11 cm/s LVOT diam:     2.10 cm  LV E/e' medial:  9.2 LV SV:         76 LV SV Index:   41 LVOT Area:     3.46 cm  RIGHT VENTRICLE RV S prime:     12.80 cm/s TAPSE (M-mode): 2.2 cm LEFT ATRIUM             Index       RIGHT ATRIUM           Index LA diam:        3.40 cm 1.85 cm/m  RA Area:     12.60 cm LA Vol (A2C):   40.3 ml 21.92 ml/m RA Volume:   24.80 ml   13.49 ml/m LA Vol (A4C):   37.1 ml 20.18 ml/m LA Biplane Vol: 40.3 ml 21.92 ml/m  AORTIC VALVE LVOT Vmax:  104.00 cm/s LVOT Vmean:  65.500 cm/s LVOT VTI:    0.219 m  AORTA Ao Root diam: 3.20 cm MITRAL VALVE MV Area (PHT): 2.91 cm    SHUNTS MV Decel Time: 261 msec    Systemic VTI:  0.22 m MV E velocity: 47.10 cm/s  Systemic Diam: 2.10 cm MV A velocity: 67.70 cm/s MV E/A ratio:  0.70 Lennie Odor MD Electronically signed by Lennie Odor MD Signature Date/Time: 11/05/2019/3:18:35 PM    Final    VAS US CAROTID  Result Date: 11/05/2019 Carotid Arterial Duplex Study Indications: Correlate with CTA neck. Performing Technologist: Gertie Fey MHA, RDMS, RVT, RDCS  Examination Guidelines: A complete evaluation includes B-mode imaging, spectral Doppler, color Doppler, and power Doppler as needed of all accessible portions of each vessel. Bilateral testing is considered an integral part of a complete examination. Limited examinations for reoccurring indications may be performed as noted.  Right Carotid Findings: +----------+--------+--------+--------+-----------------------+--------+           PSV cm/sEDV cm/sStenosisPlaque Description     Comments +----------+--------+--------+--------+-----------------------+--------+ CCA Prox  142     27              smooth and heterogenous         +----------+--------+--------+--------+-----------------------+--------+ CCA Distal65      15              smooth and heterogenous         +----------+--------+--------+--------+-----------------------+--------+ ICA Prox  122     32              smooth and heterogenous         +----------+--------+--------+--------+-----------------------+--------+ ICA Distal71      24                                              +----------+--------+--------+--------+-----------------------+--------+ ECA       112     18              smooth and heterogenous          +----------+--------+--------+--------+-----------------------+--------+ +----------+--------+-------+----------------+-------------------+           PSV cm/sEDV cmsDescribe        Arm Pressure (mmHG) +----------+--------+-------+----------------+-------------------+ [id number]            Multiphasic, WNL                    +----------+--------+-------+----------------+-------------------+ +---------+--------+--+--------+-+---------+ VertebralPSV cm/s28EDV cm/s5Antegrade +---------+--------+--+--------+-+---------+  Left Carotid Findings: +----------+--------+--------+--------+-----------------------+--------+           PSV cm/sEDV cm/sStenosisPlaque Description     Comments +----------+--------+--------+--------+-----------------------+--------+ CCA Prox  106     23                                              +----------+--------+--------+--------+-----------------------+--------+ CCA Distal93      21              smooth and heterogenous         +----------+--------+--------+--------+-----------------------+--------+ ICA Prox  79      20              smooth and heterogenous         +----------+--------+--------+--------+-----------------------+--------+ ICA Distal76      24                                              +----------+--------+--------+--------+-----------------------+--------+  ECA       130     20                                              +----------+--------+--------+--------+-----------------------+--------+ +----------+--------+--------+----------------+-------------------+           PSV cm/sEDV cm/sDescribe        Arm Pressure (mmHG) +----------+--------+--------+----------------+-------------------+ [id number]             Multiphasic, WNL                    +----------+--------+--------+----------------+-------------------+ +---------+--------+--+--------+-+---------+ VertebralPSV cm/s39EDV cm/s8Antegrade  +---------+--------+--+--------+-+---------+   Summary: Right Carotid: Velocities in the right ICA are consistent with an upper range                1-39% stenosis. Left Carotid: Velocities in the left ICA are consistent with a 1-39% stenosis. Vertebrals:  Bilateral vertebral arteries demonstrate antegrade flow. Subclavians: Normal flow hemodynamics were seen in bilateral subclavian              arteries. *See table(s) above for measurements and observations.  Electronically signed by Sherald Hess MD on 11/05/2019 at 5:34:44 PM.    Final     Results/Tests Pending at Time of Discharge:  . None   Discharge Medications:  Allergies as of 11/06/2019   No Known Allergies     Medication List    TAKE these medications   aspirin 325 MG EC tablet Take 1 tablet (325 mg total) by mouth daily. What changed:   medication strength  how much to take Notes to patient: Tomorrow, 11/07/19   atorvastatin 80 MG tablet Commonly known as: LIPITOR Take 1 tablet (80 mg total) by mouth daily at 6 PM. Notes to patient: Tonight, 11/06/19.   clopidogrel 75 MG tablet Commonly known as: PLAVIX Take 1 tablet (75 mg total) by mouth daily. Notes to patient: Tomorrow, 11/07/19   multivitamin with minerals Tabs tablet Take 1 tablet by mouth daily. Notes to patient: Tomorrow, 11/07/19   PHENObarbital 100 MG tablet Commonly known as: LUMINAL Take 100 mg by mouth daily. Notes to patient: Tomorrow, 11/07/19       Discharge Instructions: Please refer to Patient Instructions section of EMR for full details.  Patient was counseled important signs and symptoms that should prompt return to medical care, changes in medications, dietary instructions, activity restrictions, and follow up appointments.   Follow-Up Appointments: Future Appointments  Date Time Provider Department Center  11/16/2019  9:50 AM Grayce Sessions, NP [facility] None  12/04/2019  3:40 PM Cephus Shelling, MD [facility]     Nicki Guadalajara,  MD 11/06/2019, 10:57 PM PGY-2, [facility]

## 2019-11-06 LAB — CBC
HCT: 50.4 % (ref 39.0–52.0)
Hemoglobin: 16.7 g/dL (ref 13.0–17.0)
MCH: 29.7 pg (ref 26.0–34.0)
MCHC: 33.1 g/dL (ref 30.0–36.0)
MCV: 89.5 fL (ref 80.0–100.0)
Platelets: 220 10*3/uL (ref 150–400)
RBC: 5.63 MIL/uL (ref 4.22–5.81)
RDW: 13.7 % (ref 11.5–15.5)
WBC: 6.5 10*3/uL (ref 4.0–10.5)
nRBC: 0 % (ref 0.0–0.2)

## 2019-11-06 LAB — BASIC METABOLIC PANEL
Anion gap: 12 (ref 5–15)
BUN: 18 mg/dL (ref 8–23)
CO2: 26 mmol/L (ref 22–32)
Calcium: 8.7 mg/dL — ABNORMAL LOW (ref 8.9–10.3)
Chloride: 103 mmol/L (ref 98–111)
Creatinine, Ser: 0.83 mg/dL (ref 0.61–1.24)
GFR calc Af Amer: >60 mL/min (ref >60)
GFR calc non Af Amer: >60 mL/min (ref >60)
Glucose, Bld: 117 mg/dL — ABNORMAL HIGH (ref 70–99)
Potassium: 4.2 mmol/L (ref 3.5–5.1)
Sodium: 141 mmol/L (ref 135–145)

## 2019-11-06 MED ORDER — ATORVASTATIN CALCIUM 80 MG PO TABS: 80.0000 mg | ORAL_TABLET | Freq: Every day | ORAL | 0 refills | Status: DC

## 2019-11-06 MED ORDER — CLOPIDOGREL BISULFATE 75 MG PO TABS: 75.0000 mg | ORAL_TABLET | Freq: Every day | ORAL | 0 refills | Status: DC

## 2019-11-06 MED ORDER — ASPIRIN 325 MG PO TBEC: 325.0000 mg | DELAYED_RELEASE_TABLET | Freq: Every day | ORAL | 0 refills | Status: DC

## 2019-11-06 MED FILL — CLOPIDOGREL 75 MG TABLET: 75 | 30 days supply | Qty: 30 | Fill #0

## 2019-11-06 MED FILL — ATORVASTATIN CALCIUM 80 MG: 80 | 30 days supply | Qty: 30 | Fill #0

## 2019-11-06 MED FILL — ASPIRIN EC 325 MG TABLET: 325 | 30 days supply | Qty: 30 | Fill #0

## 2019-11-06 NOTE — TOC Transition Note (Signed)
Transition of Care [facility]) - CM/SW Discharge Note   Patient Details  Name: Deveon Kisiel MRN: [id number] Date of Birth: January 04, 1951  Transition of Care [facility]) CM/SW Contact:  Pollie Friar, RN Phone Number: 11/06/2019, 10:44 AM   Clinical Narrative:    CM met with the patient and his daughter and inquired about PCP. Daughter wants him to have new PCP. Pt is without insurance. CM inquired about the [facility] and pt's daughter was interested. Appt obtained and placed on AVS for [facility]. CM also went over [facility] pharmacy and this added to AVS.  Pt has supervision at home and transport home.  TOC delivered meds to the room and daughter was able to afford.    Final next level of care: Home/Self Care Barriers to Discharge: Inadequate or no insurance, Barriers Unresolved (comment)   Patient Goals and CMS Choice        Discharge Placement                       Discharge Plan and Services                                     Social Determinants of Health (SDOH) Interventions     Readmission Risk Interventions No flowsheet data found.

## 2019-11-06 NOTE — Discharge Instructions (Signed)
Thank you so much for allowing [place] to be part of your care.  You were evaluated at [facility] due to new onset left-sided weakness, you were found to have a new stroke.  Fortunately since your admission your symptoms have significantly improved.  While you were here, we started you on a few new medications including aspirin and Plavix which are antiplatelet (thin blood) medications, and atorvastatin which is a cholesterol lowering medication that helps reduce your risk of another stroke or heart attack in the future.  After 3 months, you will stop the aspirin.  It is very important that you continue these medications and follow-up with the vascular and neurology teams.  Additionally, it is very important that you establish care with a primary care provider for your additional concerns that may be contributing to your risk such as underlying diabetes.  Your sugar was elevated while you are here and should be treated.  We encourage you to try to follow a well-balanced diet and stay physically active.  If you have recurrence of any facial droop, extremity weakness/numbness, or chest pain/shortness of breath-please seek medical care right away.  ===================================================  .rxavs  Accidente cerebrovascular isqumico Ischemic Stroke  Un accidente cerebrovascular isqumico es la muerte sbita del tejido cerebral. La sangre transporta oxgeno a todas las zonas del cuerpo. Este tipo de accidente cerebrovascular ocurre cuando el flujo sanguneo hacia el cerebro no es normal. El cerebro no puede recibir el oxgeno que necesita. Esto es Radio broadcast assistant. Se debe tratar a la persona de inmediato. Los sntomas de un accidente cerebrovascular, por lo general, ocurren de repente. Es posible que los advierta cuando se despierte. Pueden incluir los siguientes:  Debilidad o prdida de la sensibilidad en el rostro, el brazo o la pierna. Esto suele pasar de un solo lado del cuerpo.  Dificultad  para caminar.  Dificultad para mover los brazos o las piernas.  Prdida del equilibrio o de la coordinacin.  Sentirse confundido.  Dificultad para hablar o comprender lo que las [employer].  Hablar arrastrando las palabras.  Dificultad para ver.  Ver dos imgenes de un nico objeto (diplopia).  Sensacin de [employer].  Malestar estomacal (nuseas) y vmitos.  Dolor de cabeza intenso sin razn aparente. Obtenga ayuda apenas note cualquiera de [employer]. Esto es importante. Algunos tratamientos son ms eficaces si se aplican de inmediato. Estos incluyen los siguientes:  Aspirina.  Medicamentos para controlar la presin arterial.  Una inyeccin de medicamentos para romper el cogulo de [place].  Tratamientos en los vasos sanguneos (arterias) para eliminar el cogulo o romperlo. Otros tratamientos pueden incluir los siguientes:  Oxgeno.  Recibir lquidos por un tubo (catter) intravenoso.  Medicamentos para Teacher, early years/pre.  Procedimientos para ayudar a que la sangre circule con mayor facilidad. Qu incrementa el riesgo? Hay algunos factores que lo hacen ms propenso a tener un accidente cerebrovascular. Algunos de [employer] puede [place], como por ejemplo:  Tener mucho sobrepeso (obesidad).  Consumo de cigarrillos.  Usar anticonceptivos orales.  Ser sedentario.  Beber alcohol en exceso.  Consumir drogas. Otros factores de riesgo son los siguientes:  Presin arterial alta.  Colesterol alto.  Diabetes.  Enfermedad cardaca.  Ser afroamericano, norteamericano nativo, hispano o nativo de [place].  Ser mayor de 79aos.  Antecedentes familiares de accidente cerebrovascular.  Tener antecedentes de cogulos de [place], accidente cerebrovascular o accidente cerebrovascular de advertencia (accidente isqumico transitorio, AIT).  Anemia drepanoctica.  Ser mujer con antecedentes de hipertensin arterial en el embarazo  (  preeclampsia).  Cefalea migraosa.  Apnea del sueo.  Tener latidos cardacos irregulares (fibrilacin auricular).  Enfermedades a Barrister's clerk (crnicas) que causan dolor e hinchazn (inflamacin).  Trastornos que afectan la coagulacin de la [place]. Siga estas indicaciones en su casa: Medicamentos  [employer] de venta libre y los recetados solamente como se lo haya indicado el mdico.  Si le indicaron que tome aspirinas u otros medicamentos para diluir la sangre, tmelos exactamente como se lo haya indicado el mdico. ? El exceso de estos medicamentos puede provocar una hemorragia. ? Si no toma la cantidad suficiente, es posible que no sean tan eficaces.  Conozca los efectos secundarios de los medicamentos. Si toma anticoagulantes, asegrese de lo siguiente: ? Ejercer presin sobre las heridas por ms [employer] lo habitual. ? Informe a su dentista y a otros mdicos que toma estos medicamentos. ? Evite actividades que puedan causarle daos o lesiones en el cuerpo. Comida y bebida  Siga las indicaciones del mdico respecto de lo que no puede comer o beber.  Consuma alimentos saludables.  Si tiene dificultades para tragar, haga lo siguiente para evitar ahogarse: ? Coma de a porciones pequeas. ? Coma comidas blandas o en pur. Seguridad  Siga las indicaciones del equipo mdico con respecto a la actividad fsica.  Use un andador o un bastn segn las indicaciones del mdico.  Sherilyn Cooter de su hogar un lugar seguro para evitar cadas. Puede incluir: ? Hacer que profesionales inspeccionen su casa para asegurarse de que sea segura. ? Colocar barras para sostn en la habitacin y el bao. ? Elevar el inodoro. ? Colocar un asiento en la ducha. Indicaciones generales  No use productos que contengan tabaco. ? Estos incluyen cigarrillos, tabaco para Higher education careers adviser y Psychologist, sport and exercise. ? Si necesita ayuda para dejar de fumar, consulte al mdico.  Limite la cantidad de  alcohol que consume. Esto significa no ms de 1 medida por da si es mujer y no est Music therapist, y de 2 medidas por da si es hombre. Una medida equivale a 12oz de Chartered certified accountant, 5oz de vino o 1oz de bebidas alcohlicas de alta graduacin.  Si necesita ayuda para dejar de consumir drogas o alcohol, pdale al mdico que le recomiende un programa o que lo derive a Teaching laboratory technician.  Mantngase activo. Haga ejercicios tal como le indic el mdico.  Concurra a todas las visitas de control como se lo haya indicado el mdico. Esto es importante. Solicite ayuda inmediatamente si:   Tiene algn signo de accidente cerebrovascular. "BE FAST" es una manera fcil de recordar las principales seales de advertencia: ? B - Balance (equilibrio). Los signos son mareos, dificultad repentina para caminar o prdida del equilibrio. ? E - Eyes (ojos). Los signos son dificultad para ver o un cambio en la visin. ? F - Face (rostro). Las seales son debilidad repentina o prdida de la sensibilidad en la cara, o que la cara o el prpado se caigan hacia un lado. ? A - Arms (brazos). Los signos son debilidad o prdida de la sensibilidad en un brazo. Esto sucede de repente y generalmente en un lado del cuerpo. ? S - Speech (habla). Los signos son dificultad para hablar, hablar arrastrando las palabras o dificultad para comprender lo que la gente dice. ? T - Time (tiempo). Es tiempo de llamar al servicio de Multimedia programmer. Anote la hora a la que [employer] sntomas.  Presenta otros signos de un accidente cerebrovascular, como los siguientes: ? Dolor de [place] repentino y [place] intenso  sin causa aparente. ? Malestar estomacal (nuseas). ? Ganas de devolver (vmitos). ? Movimientos espasmdicos que no puede controlar (convulsiones). Estos sntomas pueden Customer service manager. No espere a ver si los sntomas desaparecen. Solicite atencin mdica de inmediato. Comunquese con el servicio de emergencias de su localidad (911 en  los [place]). No conduzca por sus propios medios [employer]. Resumen  Un accidente cerebrovascular isqumico es la muerte sbita del tejido cerebral.  Los sntomas de un accidente cerebrovascular, por lo general, ocurren de repente. Es posible que los advierta cuando se despierte.  Si tiene signos de un accidente cerebrovascular, obtenga ayuda de inmediato. Esto es importante. Algunos tratamientos son ms eficaces si se aplican de inmediato. Esta informacin no tiene Theme park manager el consejo del mdico. Asegrese de hacerle al mdico cualquier pregunta que tenga. Document Revised: 03/03/2018 Document Reviewed: 03/03/2018 Elsevier Patient Education  2020 [employer].  ===============================================

## 2019-11-06 NOTE — Progress Notes (Signed)
Nsg Discharge Note  Admit Date:  11/04/2019 Discharge date: 11/06/2019   Randall Coffey to be D/C'd Home per MD order.  AVS completed.  Copy for chart, and copy for patient signed, and dated. Patient/caregiver Randall Coffey able to verbalize understanding.  Discharge Medication: Allergies as of 11/06/2019   No Known Allergies     Medication List    TAKE these medications   aspirin 325 MG EC tablet Take 1 tablet (325 mg total) by mouth daily. What changed:   medication strength  how much to take Notes to patient: Tomorrow, 11/07/19   atorvastatin 80 MG tablet Commonly known as: LIPITOR Take 1 tablet (80 mg total) by mouth daily at 6 PM. Notes to patient: Tonight, 11/06/19.   clopidogrel 75 MG tablet Commonly known as: PLAVIX Take 1 tablet (75 mg total) by mouth daily. Notes to patient: Tomorrow, 11/07/19   multivitamin with minerals Tabs tablet Take 1 tablet by mouth daily. Notes to patient: Tomorrow, 11/07/19   PHENObarbital 100 MG tablet Commonly known as: LUMINAL Take 100 mg by mouth daily. Notes to patient: Tomorrow, 11/07/19       Discharge Assessment: Vitals:   11/06/19 1551 11/06/19 1554  BP: (!) 155/91 (!) 143/81  Pulse: 65   Resp: 18   Temp:  98.3 F (36.8 C)  SpO2: 98%    Skin clean, dry and intact without evidence of skin break down, no evidence of skin tears noted. IV catheter discontinued intact. Site without signs and symptoms of complications - no redness or edema noted at insertion site, patient denies c/o pain - only slight tenderness at site.  Dressing with slight pressure applied.  D/c Instructions-Education: Discharge instructions given to patient/family with verbalized understanding. D/c education completed with patient/family including follow up instructions, medication list, d/c activities limitations if indicated, with other d/c instructions as indicated by MD - patient able to verbalize understanding, all questions fully answered. Patient  instructed to return to ED, call 911, or call MD for any changes in condition.  Patient escorted via WC, and D/C home via private auto.  Luellen Pucker, RN 11/06/2019 4:16 PM

## 2019-11-06 NOTE — Plan of Care (Signed)
Patient afebrile this shift, able to get up to bathroom with minimal assistance, denies SOB. Patient tolerating meals, patient with bowel movement this morning. Extensive education regarding stroke prevention with aspirin, plavix and follow up with primary care MD provided to patient and family.

## 2019-11-06 NOTE — Evaluation (Signed)
Speech Language Pathology Evaluation Patient Details Name: Randall Coffey MRN: [id number] DOB: 1950-08-21 Today's Date: 11/06/2019 Time: 6387-5643 SLP Time Calculation (min) (ACUTE ONLY): 24 min  Problem List:  Patient Active Problem List   Diagnosis Date Noted  . Acute CVA (cerebrovascular accident) (HCC)   . Stroke-like symptoms 11/04/2019   Past Medical History:  Past Medical History:  Diagnosis Date  . Seizures (HCC)    Past Surgical History: The histories are not reviewed yet. Please review them in the "History" navigator section and refresh this SmartLink. HPI:  Patient is a 69 y/o male who presents with facial droop and left UE weakness/numbness. MRI showing small acute infarcts in the right frontoparietal region; sensorimotor gyri. PMH includes seizures.   Assessment / Plan / Recommendation Clinical Impression   Patient's cognitive linguistic functioning was assessed through use of in-person interpreter Ashby Dawes). Pt had difficulties setting the correct time during a clock-drawing task, however, the pt's daughter stated the pt has trouble reading an analog clock and would have difficulties completing this task PTA. All other subtests were [facility]. Per patient and his family, he is functioning at his baseline. Patient will not need further SLP services at this time. Will s/o.    SLP Assessment  SLP Recommendation/Assessment: Patient does not need any further Speech Lanaguage Pathology Services SLP Visit Diagnosis: Cognitive communication deficit (R41.841)    Follow Up Recommendations  None    Frequency and Duration           SLP Evaluation Cognition  Overall Cognitive Status: Within Functional Limits for tasks assessed Arousal/Alertness: Awake/alert Orientation Level: Oriented X4 Attention: Sustained Sustained Attention: Appears intact Memory: Appears intact Awareness: Appears intact Problem Solving: Impaired Problem Solving Impairment: Functional  complex Safety/Judgment: Appears intact       Comprehension  Auditory Comprehension Overall Auditory Comprehension: Appears within functional limits for tasks assessed    Expression Expression Primary Mode of Expression: Verbal Verbal Expression Overall Verbal Expression: Appears within functional limits for tasks assessed Written Expression Dominant Hand: Right Written Expression: Within Functional Limits   Oral / Motor  Oral Motor/Sensory Function Overall Oral Motor/Sensory Function: Within functional limits Motor Speech Overall Motor Speech: Appears within functional limits for tasks assessed   GO                   Maudry Mayhew, Student SLP Office: [phone]  11/06/2019, 2:11 PM

## 2019-11-06 NOTE — Progress Notes (Addendum)
  Progress Note    11/06/2019 7:41 AM * No surgery found *  Subjective:  No complaints. No new neurological symptoms over night. Ambulated in room yesterday. Daughter in law assisted in translating   Vitals:   11/05/19 1636 11/05/19 2000  BP: (!) 157/90 (!) 142/67  Pulse: 69 65  Resp: (!) 23 20  Temp: 98.4 F (36.9 C)   SpO2: 93% 97%   Physical Exam: General: well appearing, well nourished, not in any discomfort Lungs:  Non labored Extremities:extremities well perfused. No motor or sensory deficits Abdomen:  Soft, non tender Neurologic: Alert and oriented, no focal weakness or paresthesias. Speech fluent and coherent. Tongue midline. Face symmetrical  CBC    Component Value Date/Time   WBC 6.5 11/06/2019 0606   RBC 5.63 11/06/2019 0606   HGB 16.7 11/06/2019 0606   HCT 50.4 11/06/2019 0606   PLT 220 11/06/2019 0606   MCV 89.5 11/06/2019 0606   MCV 87.1 03/26/2016 1554   MCH 29.7 11/06/2019 0606   MCHC 33.1 11/06/2019 0606   RDW 13.7 11/06/2019 0606   LYMPHSABS 1.9 11/04/2019 1427   MONOABS 0.5 11/04/2019 1427   EOSABS 0.1 11/04/2019 1427   BASOSABS 0.0 11/04/2019 1427    BMET    Component Value Date/Time   NA 141 11/06/2019 0606   K 4.2 11/06/2019 0606   CL 103 11/06/2019 0606   CO2 26 11/06/2019 0606   GLUCOSE 117 (H) 11/06/2019 0606   BUN 18 11/06/2019 0606   CREATININE 0.83 11/06/2019 0606   CALCIUM 8.7 (L) 11/06/2019 0606   GFRNONAA >60 11/06/2019 0606   GFRAA >60 11/06/2019 0606    INR    Component Value Date/Time   INR 1.0 11/04/2019 1427     Intake/Output Summary (Last 24 hours) at 11/06/2019 0741 Last data filed at 11/05/2019 2000 Gross per 24 hour  Intake 360 ml  Output 525 ml  Net -165 ml     Assessment/Plan:  69 y.o. male is s/p right brain stroke. Found to have <50% right ICA stenosis on CTA and 1-39% on carotid duplex. His symptoms have resolved. He will go home on Aspirin, Plavix and statin.  I re discussed stroke like/ TIA symptoms  with patient and his daughter in law and advised them to seek immediate medical attention should these occur. He will follow up in 1 month with Dr. Loistine Chance, PA-C [facility] [phone] 11/06/2019 7:41 AM   I have seen and evaluated the patient. I agree with the PA note as documented above.  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  No new neurologic events overnight.  As discussed yesterday with the patient and his family at bedside will delay surgical intervention at this time given duplex suggest 1 to 39% stenosis and by NASCET criteria on CTA approximately 40% and would favor medical management with dual antiplatelet therapy after discussion with Dr. Roda Shutters with stroke team.  Will arrange follow-up in 1 month.  If he has another event certainly will need carotid intervention.  Cephus Shelling, MD [facility] [place] Office: [phone]

## 2019-11-16 ENCOUNTER — Telehealth (INDEPENDENT_AMBULATORY_CARE_PROVIDER_SITE_OTHER): Payer: Self-pay | Admitting: Primary Care

## 2019-11-26 ENCOUNTER — Ambulatory Visit (HOSPITAL_COMMUNITY)
Admission: EM | Admit: 2019-11-26 | Discharge: 2019-11-26 | Disposition: A | Discharge status: Home / Self Care | Payer: Self-pay

## 2019-11-26 ENCOUNTER — Other Ambulatory Visit: Payer: Self-pay

## 2019-11-26 ENCOUNTER — Encounter (HOSPITAL_COMMUNITY): Payer: Self-pay

## 2019-11-26 HISTORY — DX: Cerebral infarction, unspecified: I63.9

## 2019-11-26 NOTE — ED Triage Notes (Addendum)
Pt states he is having "ringing" in his ears x 1 week.Pt reports he is having intermittent episodes of numbness and tingling sensation in fingers left and right hand, pt states the numbness and tingling improved since the stoke episode.  Pt reports in hsi eyes x 1 year. Per familly member pt had a stroke on 11/04/2019.

## 2019-11-28 ENCOUNTER — Other Ambulatory Visit: Payer: Self-pay

## 2019-11-28 ENCOUNTER — Ambulatory Visit: Payer: Self-pay | Admitting: Adult Health

## 2019-11-28 ENCOUNTER — Encounter: Payer: Self-pay | Admitting: Adult Health

## 2019-11-28 VITALS — BP 132/84 | HR 84 | Temp 97.4°F | Wt 159.0 lb

## 2019-11-28 DIAGNOSIS — E119 Type 2 diabetes mellitus without complications: Secondary | ICD-10-CM

## 2019-11-28 DIAGNOSIS — I639 Cerebral infarction, unspecified: Secondary | ICD-10-CM

## 2019-11-28 DIAGNOSIS — I10 Essential (primary) hypertension: Secondary | ICD-10-CM

## 2019-11-28 DIAGNOSIS — E785 Hyperlipidemia, unspecified: Secondary | ICD-10-CM

## 2019-11-28 DIAGNOSIS — I6521 Occlusion and stenosis of right carotid artery: Secondary | ICD-10-CM

## 2019-11-28 MED ORDER — ASPIRIN 325 MG PO TBEC: 325.0000 mg | DELAYED_RELEASE_TABLET | Freq: Every day | ORAL | 0 refills | Status: DC

## 2019-11-28 MED ORDER — ATORVASTATIN CALCIUM 80 MG PO TABS: 80.0000 mg | ORAL_TABLET | Freq: Every day | ORAL | 3 refills | Status: DC

## 2019-11-28 MED ORDER — CLOPIDOGREL BISULFATE 75 MG PO TABS: 75.0000 mg | ORAL_TABLET | Freq: Every day | ORAL | 3 refills | Status: DC

## 2019-11-28 NOTE — Patient Instructions (Signed)
Your ear concerns are likely residual from your stroke and should improve over time - if worsens or you develop near stroke symptoms please call 911 immediately   Follow up with Dr. Arbie Cookey vascular surgery for ongoing monitoring of your carotid narrowing   Continue aspirin 325 mg daily and clopidogrel 75 mg daily  and lipitor  for secondary stroke prevention  Continue both aspirin and plavix for additional 2 months and then continue plavix alone - aspirin will be stopped at that time - will refill plavix but ongoing refills will need to be through your primary doctor   Refill also placed for Lipitor but future refills will need to be through your PCP  Continue to follow up with PCP regarding cholesterol, blood pressure and diabetes management   Continue to monitor blood pressure at home  Maintain strict control of hypertension with blood pressure goal below 130/90, diabetes with hemoglobin A1c goal below 6.5% and cholesterol with LDL cholesterol (bad cholesterol) goal below 70 mg/dL. I also advised the patient to eat a healthy diet with plenty of whole grains, cereals, fruits and vegetables, exercise regularly and maintain ideal body weight.  Followup in the future with me in 3 months or call earlier if needed       Thank you for coming to see [place] at [facility]. I hope we have been able to provide you high quality care today.  You may receive a patient satisfaction survey over the next few weeks. We would appreciate your feedback and comments so that we may continue to improve ourselves and the health of our patients.

## 2019-11-28 NOTE — Progress Notes (Signed)
[facility] [address] [place]. [place] [address] [phone]       HOSPITAL FOLLOW UP NOTE  Randall Coffey Date of Birth:  August 17, 1950 Medical Record Number:  [id number]   Reason for Referral:  hospital stroke follow up    SUBJECTIVE:   CHIEF COMPLAINT:  Chief Complaint  Patient presents with  . Follow-up    hospital fu, with daughter     HPI:   Randall Coffey is a 69 y.o. male with history of seizures  who presented on 11/03/2019 with L sided weakness/numbness and difficulty speaking.  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  Evaluated by vascular surgery and recommended maximize medical treatment with DAPT for 3 months then Plavix alone and follow-up with VVS outpatient.  HTN stabilized during admission with BP goal normotensive range.  LDL 197 and initiated atorvastatin 80 mg daily.  New diagnosis of DM with A1c 6.6 and recommend close PCP follow-up.  Other stroke risk factors include advanced age but no prior history of stroke.  Other active problems include history of seizures on phenobarbital, left side back pain with sciatica and hyperkalemia.  No residual deficits and was discharged home in stable condition without therapy needs.  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 Nonspecific nodule R frontal lobe. Sinus dz.   MRI  Small R frontoparietal infarcts involving sensorimotor gyri  CTA head & neck no LVO. B ICA bifurcation atherosclerosis w/o stenosis. High risk soft plaques at right ICA although no significant stenosis   Carotid Doppler  B ICA 1-39% stenosis, VAs antegrade   2D Echo EF 60-65%  LDL 197  HgbA1c 6.6  Lovenox 40 mg sq daily for VTE prophylaxis  aspirin 81 mg daily prior to admission, now on aspirin 325 mg daily  and clopidogrel 75 mg daily following plavix load. Continue DAPT x 3 months then plavix alone   Therapy recommendations:  No PT or OT needs  Disposition:  Return home   Today, 11/28/2019, Randall Coffey is being seen for hospital follow-up accompanied by interpreter and daughter.  Daughter called requesting sooner evaluation due to new concerns of constant ear ringing and intermittent episodes of left hand numbness/tingling.  Numbness/tingling present since stroke admission with ear ringing started approximately 1 week after discharge.  Not bothersome and reports humming noise bilaterally.  Denies hearing loss, fullness or pain.  Noise does not change quality with positional changes or with increased activity and denies pulsating.  Denies any new onset symptoms or any other associated symptoms.  He does endorse increased anxiety post stroke.  He has returned back to most prior activities without great difficulty.  He continues on DAPT with aspirin 325 mg daily and clopidogrel 75 mg daily without bleeding or bruising.  Continues on atorvastatin 80 mg daily without myalgias.  Blood pressure today 132/84.  Has scheduled follow-up visit with vascular surgery [name] on 12/04/2019.  No further concerns at this time.      ROS:   14 system review of systems performed and negative with exception of tinnitus, anxiety and numbness/tingling  PMH:  Past Medical History:  Diagnosis Date  . Seizures ([place])   . Stroke [facility])     PSH: No past surgical history on file.  Social History:  Social History   Socioeconomic History  .  Marital status: Married    Spouse name: Not on file  . Number of children: Not on file  . Years of education: Not on file  . Highest education level: Not on file  Occupational History  . Not on file  Tobacco Use  . Smoking status: Never Smoker  . Smokeless tobacco: Never Used  Substance and Sexual Activity  . Alcohol use: No  . Drug use: No  . Sexual activity:  Never  Other Topics Concern  . Not on file  Social History Narrative  . Not on file   Social Determinants of Health   Financial Resource Strain:   . Difficulty of Paying Living Expenses:   Food Insecurity:   . Worried About Programme researcher, broadcasting/film/video in the Last Year:   . Barista in the Last Year:   Transportation Needs:   . Freight forwarder (Medical):   [name] Lack of Transportation (Non-Medical):   Physical Activity:   . Days of Exercise per Week:   . Minutes of Exercise per Session:   Stress:   . Feeling of Stress :   Social Connections:   . Frequency of Communication with Friends and Family:   . Frequency of Social Gatherings with Friends and Family:   . Attends Religious Services:   . Active Member of Clubs or Organizations:   . Attends Banker Meetings:   [name] Marital Status:   Intimate Partner Violence:   . Fear of Current or Ex-Partner:   . Emotionally Abused:   [name] Physically Abused:   . Sexually Abused:     Family History: No family history on file.  Medications:   Current Outpatient Medications on File Prior to Visit  Medication Sig Dispense Refill  . aspirin EC 325 MG EC tablet Take 1 tablet (325 mg total) by mouth daily. 30 tablet 0  . atorvastatin (LIPITOR) 80 MG tablet Take 1 tablet (80 mg total) by mouth daily at 6 PM. 30 tablet 0  . clopidogrel (PLAVIX) 75 MG tablet Take 1 tablet (75 mg total) by mouth daily. 30 tablet 0  . Multiple Vitamin (MULTIVITAMIN WITH MINERALS) TABS tablet Take 1 tablet by mouth daily.    [name] PHENObarbital (LUMINAL) 100 MG tablet Take 100 mg by mouth daily.   3   No current facility-administered medications on file prior to visit.    Allergies:  No Known Allergies    OBJECTIVE:  Physical Exam  Vitals:   11/28/19 1507  BP: 132/84  Pulse: 84  Temp: (!) 97.4 F (36.3 C)  Weight: 159 lb (72.1 kg)   Body mass index is 25.66 kg/m. No exam data present  General: well developed, well nourished,  pleasant  middle-age male, seated, in no evident distress Head: head normocephalic and atraumatic.   Neck: supple with no carotid or supraclavicular bruits Cardiovascular: regular rate and rhythm, no murmurs Musculoskeletal: no deformity Skin:  no rash/petichiae Vascular:  Normal pulses all extremities   Neurologic Exam Mental Status: Awake and fully alert.   Primarily Spanish-speaking but daughter declines speech or language difficulty.  Oriented to place and time. Recent and remote memory intact. Attention span, concentration and fund of knowledge appropriate. Mood and affect appropriate.  Cranial Nerves: Fundoscopic exam reveals sharp disc margins. Pupils equal, briskly reactive to light. Extraocular movements full without nystagmus. Visual fields full to confrontation. Normal weber and rinne testing.  Hearing intact. Facial sensation intact. Face, tongue, palate moves normally and symmetrically.  Motor: Normal bulk  and tone. Normal strength in all tested extremity muscles. Sensory.: intact to touch , pinprick , position and vibratory sensation.  Coordination: Rapid alternating movements normal in all extremities. Finger-to-nose and heel-to-shin performed accurately bilaterally. Gait and Station: Arises from chair without difficulty. Stance is normal. Gait demonstrates normal stride length and balance Reflexes: 1+ and symmetric. Toes downgoing.     NIHSS  0 Modified Rankin  1      ASSESSMENT: Randall Coffey is a 69 y.o. 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  Recommended max medical therapy with DAPT for 3 months as well as initiation of statin and following up with vascular surgery outpatient.  Vascular risk factors include HTN, HLD, new dx of DM, history of seizures, and right ICA high-grade  soft plaque.  Residual deficits of subjective left fingertip numbness/tingling as well as bilateral tinnitus 1 week after discharge.     PLAN:  1. Right frontoparietal stroke: Continue aspirin 325 mg daily and clopidogrel 75 mg daily  and atorvastatin 80 mg daily for secondary stroke prevention.  Daughter requested refills which was provided but advised ongoing refills will need to be obtained through PCP.  Continue DAPT for total of 33-month duration or as advised by vascular surgery.  Maintain strict control of hypertension with blood pressure goal below 130/90, diabetes with hemoglobin A1c goal below 6.5% and cholesterol with LDL cholesterol (bad cholesterol) goal below 70 mg/dL.  I also advised the patient to eat a healthy diet with plenty of whole grains, cereals, fruits and vegetables, exercise regularly with at least 30 minutes of continuous activity daily and maintain ideal body weight. 2. Right ICA plaque: Complete 3 months DAPT as well as ongoing use of statin and ensure follow-up with vascular surgery on 12/04/2019 for ongoing monitoring and management 3. Tinnitus: Appears to be more stroke related with sensorimotor symptoms as well as possible underlying anxiety contribution with low suspicion for vascular tinnitus or more concerning etiology based on evaluation and symptoms reported.  Advised to continue to monitor for now and if stroke related, hopeful ongoing improvement but advised if any worsening or accompanied by new onset stroke symptoms to call 911 immediately for further evaluation. 4. HTN: Stable.  Continue to follow with PCP for monitoring management 5. HLD: Continuation of atorvastatin and follow-up with PCP for prescribing monitoring and management 6. DMII: Continue to follow with PCP for monitoring management    Follow up in 3 months or call earlier if needed   I spent 45 minutes of face-to-face and non-face-to-face time with patient, daughter and interpreter.  This  included previsit chart review, lab review, study review, order entry, electronic health record documentation, patient education regarding recent stroke, residual deficits, importance of managing stroke risk factors and answered all questions to patient satisfaction     Ihor Austin, [facility]  [facility] [address] [place], [place] [address]  Phone [phone] Fax [phone] Note: This document was prepared with digital dictation and possible smart phrase technology. Any transcriptional errors that result from this process are unintentional.

## 2019-12-03 NOTE — Progress Notes (Signed)
I agree with the above plan 

## 2019-12-04 ENCOUNTER — Ambulatory Visit (INDEPENDENT_AMBULATORY_CARE_PROVIDER_SITE_OTHER): Payer: Self-pay | Admitting: Vascular Surgery

## 2019-12-04 ENCOUNTER — Other Ambulatory Visit: Payer: Self-pay

## 2019-12-04 ENCOUNTER — Encounter: Payer: Self-pay | Admitting: Vascular Surgery

## 2019-12-04 VITALS — BP 143/81 | HR 62 | Temp 98.2°F | Resp 20 | Ht 66.0 in | Wt 158.0 lb

## 2019-12-04 DIAGNOSIS — I6529 Occlusion and stenosis of unspecified carotid artery: Secondary | ICD-10-CM | POA: Insufficient documentation

## 2019-12-04 DIAGNOSIS — I6523 Occlusion and stenosis of bilateral carotid arteries: Secondary | ICD-10-CM

## 2019-12-04 DIAGNOSIS — I639 Cerebral infarction, unspecified: Secondary | ICD-10-CM

## 2019-12-04 NOTE — Progress Notes (Signed)
Patient name: Randall Coffey MRN: [id number] DOB: 11/23/1950 Sex: male  REASON FOR VISIT: Hospital follow-up for carotid artery disease  HPI: Randall Coffey is a 69 y.o. male who presents for hospital follow-up for evaluation of his carotid artery disease.  He was initially seen in the hospital with left upper extremity weakness and ultimately found to have small right brain stroke on MRI.  Ultimately on further evaluation CT showed less than 50% stenosis and duplex confirmed a 1 to 39% stenosis in the right ICA.  We then recommended medical management with aspirin statin at discharge as well as Plavix.  Seems to be at his baseline today talking with interpreter.  Has had no new focal neurologic events.  Does complain of some numbness in the left hand when he is sweeping that apparently was present prior to his stroke.  Past Medical History:  Diagnosis Date  . Seizures ([place])   . Stroke [facility])     History reviewed. No pertinent surgical history.  History reviewed. No pertinent family history.  SOCIAL HISTORY: Social History   Tobacco Use  . Smoking status: Never Smoker  . Smokeless tobacco: Never Used  Substance Use Topics  . Alcohol use: No    No Known Allergies  Current Outpatient Medications  Medication Sig Dispense Refill  . aspirin 325 MG EC tablet Take 1 tablet (325 mg total) by mouth daily. 60 tablet 0  . atorvastatin (LIPITOR) 80 MG tablet Take 1 tablet (80 mg total) by mouth daily at 6 PM. 90 tablet 3  . clopidogrel (PLAVIX) 75 MG tablet Take 1 tablet (75 mg total) by mouth daily. 90 tablet 3  . Multiple Vitamin (MULTIVITAMIN WITH MINERALS) TABS tablet Take 1 tablet by mouth daily.    [name] PHENObarbital (LUMINAL) 100 MG tablet Take 100 mg by mouth daily.   3   No current facility-administered medications for this visit.    REVIEW OF SYSTEMS:  [X]  denotes positive finding, [ ]  denotes negative finding Cardiac  Comments:  Chest pain or chest pressure:     Shortness of breath upon exertion:    Short of breath when lying flat:    Irregular heart rhythm:        Vascular    Pain in calf, thigh, or hip brought on by ambulation:    Pain in feet at night that wakes you up from your sleep:     Blood clot in your veins:    Leg swelling:         Pulmonary    Oxygen at home:    Productive cough:     Wheezing:         Neurologic    Sudden weakness in arms or legs:     Sudden numbness in arms or legs:     Sudden onset of difficulty speaking or slurred speech:    Temporary loss of vision in one eye:     Problems with dizziness:         Gastrointestinal    Blood in stool:     Vomited blood:         Genitourinary    Burning when urinating:     Blood in urine:        Psychiatric    Major depression:         Hematologic    Bleeding problems:    Problems with blood clotting too easily:        Skin    Rashes or ulcers:  Constitutional    Fever or chills:      PHYSICAL EXAM: Vitals:   12/04/19 1549  BP: (!) 143/81  Pulse: 62  Resp: 20  Temp: 98.2 F (36.8 C)  SpO2: 96%  Weight: 158 lb (71.7 kg)  Height: 5\' 6"  (1.676 m)    GENERAL: The patient is a well-nourished male, in no acute distress. The vital signs are documented above. CARDIAC: There is a regular rate and rhythm.  PULMONARY: There is good air exchange bilaterally without wheezing or rales. ABDOMEN: Soft and non-tender with normal pitched bowel sounds.  MUSCULOSKELETAL: There are no major deformities or cyanosis. NEUROLOGIC: No focal weakness or paresthesias are detected.  CN II through XII grossly intact. SKIN: There are no ulcers or rashes noted. PSYCHIATRIC: The patient has a normal affect.  DATA:   None  Assessment/Plan:  69 year old male that presents for hospital follow-up after being seen in consultation for a suspected ulcerated plaque in the right ICA with less than 50% stenosis by CT and 1 to 39% stenosis by duplex.  Ultimately we elected  medical management with dual antiplatelet therapy.  He has had no further neurologic events since discharge.  I reiterated the plan with him and the family for medical management  given less than 50% stenosis which is supported by current literature.  We will see him in 1 year with another duplex for ongoing surveillance.  He knows to call with questions or concerns.   73, MD [facility] Office: [phone]

## 2019-12-05 ENCOUNTER — Other Ambulatory Visit: Payer: Self-pay | Admitting: *Deleted

## 2019-12-05 DIAGNOSIS — I6523 Occlusion and stenosis of bilateral carotid arteries: Secondary | ICD-10-CM

## 2019-12-12 ENCOUNTER — Encounter: Payer: Self-pay | Admitting: Family Medicine

## 2019-12-12 ENCOUNTER — Other Ambulatory Visit: Payer: Self-pay

## 2019-12-12 ENCOUNTER — Ambulatory Visit (INDEPENDENT_AMBULATORY_CARE_PROVIDER_SITE_OTHER): Payer: Self-pay | Admitting: Family Medicine

## 2019-12-12 DIAGNOSIS — E119 Type 2 diabetes mellitus without complications: Secondary | ICD-10-CM

## 2019-12-12 DIAGNOSIS — I10 Essential (primary) hypertension: Secondary | ICD-10-CM | POA: Insufficient documentation

## 2019-12-12 DIAGNOSIS — E1169 Type 2 diabetes mellitus with other specified complication: Secondary | ICD-10-CM

## 2019-12-12 DIAGNOSIS — E785 Hyperlipidemia, unspecified: Secondary | ICD-10-CM

## 2019-12-12 MED ORDER — LOSARTAN POTASSIUM 25 MG PO TABS: 25.0000 mg | ORAL_TABLET | Freq: Every day | ORAL | 3 refills | Status: DC

## 2019-12-12 NOTE — Patient Instructions (Addendum)
It was great to meet you!  Our plans for today:  -I have prescribed a medication for your blood pressure called losartan.  Take this medication each day as directed and we will recheck your blood pressure at your next appointment. -We checked your urine today for signs of protein in it.  We will go over the results of this test at your next appointment.  It normally takes 2 or 3 days for this result to come back. -I would like for you to make a follow-up appointment for next week to discuss your health in more detail and to see how your blood pressure is doing. -You had a question about leafy greens with Plavix, it is okay to have leafy greens while on Plavix.  Take care and seek immediate care sooner if you develop any concerns.   Dr. [facility]

## 2019-12-12 NOTE — Assessment & Plan Note (Signed)
Assessment: Type 2 diabetes at goal, diet controlled. Plan: -We will check microalbumin in urine today -We will initiate losartan for renal protection as well as blood pressure control -We will follow-up with patient next week to go over results of the microalbumin test and to reassess his blood pressure

## 2019-12-12 NOTE — Assessment & Plan Note (Signed)
Assessment: Mild hypertension with blood pressure 140/76 today, 146/82 on recheck.  Patient has had documented high blood pressures in the past as well not currently on any medication. Plan: -We will initiate losartan as patient has a history of diabetes and this medication should assist in renal protection as well as blood pressure control -We will follow-up next week for blood pressure check on the new medication and to go over other health needs at that time.

## 2019-12-12 NOTE — Progress Notes (Signed)
   Subjective:    Patient ID: Randall Coffey, male    DOB: 01/29/1951, 69 y.o.   MRN: [id number]   CC: Establish care  HPI:  Randall Coffey is a very pleasant 69 y.o. male who presents today to establish care.  Initial concerns:None currently. Recently discharged after having first stroke.  Past medical history: History of stroke earlier this year.  Carotid stenosis, hypertension, hyperlipidemia. Seizures since really young, has not had one in 3-4 years, followed by neurology. New diabetes but at goal, diet controlled.  Past surgical history:hernia surgery at age 39-15.  Family history: No hx cancers in family  Social history: Never used tobacco, no recreational drugs, consumes alcohol on occasion, maybe one beer every once in a while. Walks on treadmill every day.  Patient interview conducted with use of in-person interpreter.  ROS: pertinent noted in the HPI   Objective:  BP 140/76   Pulse (!) 52   Ht 5\' 5"  (1.651 m)   Wt 160 lb 6.4 oz (72.8 kg)   SpO2 97%   BMI 26.69 kg/m   Recheck BP 146/82 Pulse on recheck 55  Vitals and nursing note reviewed  General: NAD, pleasant, able to participate in exam Cardiac: RRR, normal heart sounds, no murmurs. Respiratory: CTAB, normal effort Abdomen: Bowel sounds present, non-tender, non-distended, no hepatosplenomegaly Extremities: no edema or cyanosis. Skin: warm and dry, no rashes noted Neuro: alert, no obvious focal deficits Psych: Normal affect and mood   Assessment & Plan:   Diabetes mellitus, type 2 (HCC) Assessment: Type 2 diabetes at goal, diet controlled. Plan: -We will check microalbumin in urine today -We will initiate losartan for renal protection as well as blood pressure control -We will follow-up with patient next week to go over results of the microalbumin test and to reassess his blood pressure  Hypertension Assessment: Mild hypertension with blood pressure 140/76 today, 146/82 on  recheck.  Patient has had documented high blood pressures in the past as well not currently on any medication. Plan: -We will initiate losartan as patient has a history of diabetes and this medication should assist in renal protection as well as blood pressure control -We will follow-up next week for blood pressure check on the new medication and to go over other health needs at that time.  At follow-up appointment next week will discuss results of microalbuminuria test, colonoscopy, referral for eye exam, pneumonia vaccine and will perform foot exam.  , DO [facility] PGY-1

## 2019-12-13 ENCOUNTER — Inpatient Hospital Stay: Payer: Self-pay | Admitting: Adult Health

## 2019-12-16 LAB — MICROALBUMIN / CREATININE URINE RATIO
Creatinine, Urine: 118 mg/dL
Microalb/Creat Ratio: 9 mg/g creat (ref 0–29)
Microalbumin, Urine: 11.1 ug/mL

## 2019-12-18 NOTE — Progress Notes (Addendum)
    SUBJECTIVE:   CHIEF COMPLAINT / HPI:   Diabetes/hypertension follow-up: Patient presents for follow-up today to discuss the results of his previous lab testing as well as to get additional labs after starting losartan during last appointment.  Patient endorses no issues at this time states he has checked his blood pressure occasionally at home seeing pressures up to 140 systolic.  He states he does not remember his diastolic numbers.  He denies other complaints. During last appointment patient presented to establish care and due to his elevated blood pressure was started on losartan.  Patient states he has never had a colonoscopy, he does not have a eye doctor and has never had diabetic eye exam as he was just recently found to have diabetes.  Patient's last A1c was 6.6, at goal for his age using no medications for diabetes.    [employer] video/iPad interpreter used for duration of encounter  PERTINENT  PMH / PSH: Recently diagnosed with diabetes, A1c at goal, diet controlled  OBJECTIVE:   BP 118/68   Pulse 73   Ht 5\' 5"  (1.651 m)   Wt 157 lb (71.2 kg)   SpO2 99%   BMI 26.13 kg/m    Diabetic Foot Exam - Simple   Simple Foot Form Visual Inspection No deformities, no ulcerations, no other skin breakdown bilaterally: Yes Sensation Testing Intact to touch and monofilament testing bilaterally: Yes Pulse Check Comments    General: NAD, pleasant, able to participate in exam Cardiac: RRR, no murmurs. Respiratory: CTAB Psych: Normal affect and mood  ASSESSMENT/PLAN:   Hypertension Assessment: Hypertension which has blood pressure at goal today.  Patient has occasional check blood pressures at home and has seen some what elevated pressures but does not know how high they are other than he saw a number at 140 once.  Continues taking losartan. Plan: -We will check BMP today as patient started losartan on last visit -Recommended patient check his blood pressure 2-3 times a day for a  week or so and let me know if he had pressures above the 140 systolic range on a regular basis.  Diabetes mellitus, type 2 (HCC) Assessment: Diabetes at goal, diet controlled. Plan: -Provide referral for ophthalmology for diabetic eye exam -Performed diabetic foot exam today -Follow-up in 3 months for A1c -Discussed results of patient's previous microalbumin labs with patient today as well.  -Referral provided for colonoscopy  , DO [facility]

## 2019-12-19 ENCOUNTER — Other Ambulatory Visit: Payer: Self-pay

## 2019-12-19 ENCOUNTER — Ambulatory Visit (INDEPENDENT_AMBULATORY_CARE_PROVIDER_SITE_OTHER): Payer: Self-pay | Admitting: Family Medicine

## 2019-12-19 ENCOUNTER — Encounter: Payer: Self-pay | Admitting: Family Medicine

## 2019-12-19 VITALS — BP 118/68 | HR 73 | Ht 65.0 in | Wt 157.0 lb

## 2019-12-19 DIAGNOSIS — E119 Type 2 diabetes mellitus without complications: Secondary | ICD-10-CM

## 2019-12-19 DIAGNOSIS — Z1159 Encounter for screening for other viral diseases: Secondary | ICD-10-CM

## 2019-12-19 DIAGNOSIS — I10 Essential (primary) hypertension: Secondary | ICD-10-CM

## 2019-12-19 DIAGNOSIS — Z1211 Encounter for screening for malignant neoplasm of colon: Secondary | ICD-10-CM

## 2019-12-19 NOTE — Patient Instructions (Signed)
It was great to see you!  Our plans for today:  -We are checking blood work today after starting the recent medication to make sure your kidney function and electrolytes look good.  It is also recommended that we screen you for hepatitis C as this can now be treated. -I will also send in referrals for a colonoscopy and a diabetic eye exam.  Is recommended to have a colonoscopy every 10 years unless they find abnormal findings, and a diabetic eye exam each year.  They should contact you in the next 1 to 2 weeks, if they do not please let me know.  We are checking some labs today, I will call you if they are abnormal will send you a MyChart message or a letter if they are normal.  If you do not hear about your labs in the next 2 weeks please let us know.  Take care and seek immediate care sooner if you develop any concerns.   Dr. [facility]

## 2019-12-19 NOTE — Assessment & Plan Note (Signed)
Assessment: Hypertension which has blood pressure at goal today.  Patient has occasional check blood pressures at home and has seen some what elevated pressures but does not know how high they are other than he saw a number at 140 once.  Continues taking losartan. Plan: -We will check BMP today as patient started losartan on last visit -Recommended patient check his blood pressure 2-3 times a day for a week or so and let me know if he had pressures above the 140 systolic range on a regular basis.

## 2019-12-19 NOTE — Assessment & Plan Note (Signed)
Assessment: Diabetes at goal, diet controlled. Plan: -Provide referral for ophthalmology for diabetic eye exam -Performed diabetic foot exam today -Follow-up in 3 months for A1c

## 2019-12-20 LAB — BASIC METABOLIC PANEL
BUN/Creatinine Ratio: 26 — ABNORMAL HIGH (ref 10–24)
BUN: 23 mg/dL (ref 8–27)
CO2: 27 mmol/L (ref 20–29)
Calcium: 9.2 mg/dL (ref 8.6–10.2)
Chloride: 102 mmol/L (ref 96–106)
Creatinine, Ser: 0.9 mg/dL (ref 0.76–1.27)
GFR calc Af Amer: 101 mL/min/{1.73_m2} (ref >59)
GFR calc non Af Amer: 87 mL/min/{1.73_m2} (ref >59)
Glucose: 120 mg/dL — ABNORMAL HIGH (ref 65–99)
Potassium: 4.5 mmol/L (ref 3.5–5.2)
Sodium: 142 mmol/L (ref 134–144)

## 2019-12-20 LAB — HEPATITIS C ANTIBODY: Hep C Virus Ab: <0.1 s/co ratio (ref 0.0–0.9)

## 2019-12-21 ENCOUNTER — Encounter: Payer: Self-pay | Admitting: Family Medicine

## 2020-03-13 ENCOUNTER — Encounter: Payer: Self-pay | Admitting: Adult Health

## 2020-03-13 ENCOUNTER — Ambulatory Visit: Payer: Self-pay | Admitting: Adult Health

## 2020-03-13 VITALS — BP 114/64 | HR 62 | Ht 65.0 in | Wt 151.8 lb

## 2020-03-13 DIAGNOSIS — R202 Paresthesia of skin: Secondary | ICD-10-CM

## 2020-03-13 DIAGNOSIS — E119 Type 2 diabetes mellitus without complications: Secondary | ICD-10-CM

## 2020-03-13 DIAGNOSIS — I6521 Occlusion and stenosis of right carotid artery: Secondary | ICD-10-CM

## 2020-03-13 DIAGNOSIS — I10 Essential (primary) hypertension: Secondary | ICD-10-CM

## 2020-03-13 DIAGNOSIS — I639 Cerebral infarction, unspecified: Secondary | ICD-10-CM

## 2020-03-13 DIAGNOSIS — E785 Hyperlipidemia, unspecified: Secondary | ICD-10-CM

## 2020-03-13 NOTE — Progress Notes (Signed)
[facility] [address] [place]. [place] [address] [phone]       HOSPITAL FOLLOW UP NOTE  [name] Date of Birth:  01/29/1951 Medical Record Number:  [id number]   Reason for Referral:  hospital stroke follow up    SUBJECTIVE:   CHIEF COMPLAINT:  Chief Complaint  Patient presents with  . Follow-up    3 mo f/u, per interpreter has noticed more tingling in fingertips and fingers on left side. Especially in the mornings.   . room 9    with daughter and interpreter    HPI:    Today, 03/13/2020, [name] returns for stroke follow-up accompanied by his daughter and interpreter.  Complains of left hand fingertip pins/needles sensation intermittently throughout the day; occassionally present in right fingertips.  He does report having symptoms for the past 5-6 years. Denies pain or weakness or difficulty functioning with hands.  Denies sensation into wrist or arm.  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   He also continues to complain of intermittent bilateral tinnitus which was discussed at prior visit  He denies residual deficits from his stroke such as left-sided weakness or dysarthria Denies new or reoccurring stroke/TIA symptoms  Completed 3 months DAPT and remains on Plavix without bleeding or bruising.  Continues on atorvastatin without myalgias.  Blood pressure today 114/64.  Continues to follow with PCP for HTN and HLD management.  No further concerns at this time    History provided for reference purposes only Update 11/28/2019: [name] is being seen for hospital follow-up accompanied by interpreter and daughter.  Daughter called requesting sooner evaluation due to new concerns of constant ear ringing and intermittent episodes of left hand numbness/tingling.  Numbness/tingling present since stroke admission with ear ringing started approximately  1 week after discharge.  Not bothersome and reports humming noise bilaterally.  Denies hearing loss, fullness or pain.  Noise does not change quality with positional changes or with increased activity and denies pulsating.  Denies any new onset symptoms or any other associated symptoms.  He does endorse increased anxiety post stroke.  He has returned back to most prior activities without great difficulty.  He continues on DAPT with aspirin 325 mg daily and clopidogrel 75 mg daily without bleeding or bruising.  Continues on atorvastatin 80 mg daily without myalgias.  Blood pressure today 132/84.  Has scheduled follow-up visit with vascular surgery Dr. Chestine Spore on 12/04/2019.  No further concerns at this time.  Stroke admission 11/03/2019: [name] is a 69 y.o. male with history of seizures  who presented on 11/03/2019 with L sided weakness/numbness and difficulty speaking.  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  Evaluated by vascular surgery and recommended maximize medical treatment with DAPT for 3 months then Plavix alone and follow-up with VVS outpatient.  HTN stabilized during admission with BP goal normotensive range.  LDL 197 and initiated atorvastatin 80 mg daily.  New diagnosis of DM with A1c 6.6 and recommend close PCP follow-up.  Other stroke risk factors include advanced age but no prior history of stroke.  Other active problems include history of seizures on phenobarbital, left side back pain with sciatica and hyperkalemia.  No residual deficits and was discharged home in stable condition without therapy needs.  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 Nonspecific nodule R frontal lobe. Sinus dz.   MRI  Small R frontoparietal infarcts involving sensorimotor gyri  CTA head & neck no LVO. B ICA  bifurcation atherosclerosis w/o stenosis. High risk soft plaques at right ICA although no significant stenosis   Carotid Doppler  B ICA 1-39% stenosis, VAs antegrade   2D Echo EF 60-65%  LDL 197  HgbA1c 6.6  Lovenox 40 mg sq daily for VTE prophylaxis  aspirin 81 mg daily prior to admission, now on aspirin 325 mg daily and clopidogrel 75 mg daily following plavix load. Continue DAPT x 3 months then plavix alone   Therapy recommendations:  No PT or OT needs  Disposition:  Return home       ROS:   14 system review of systems performed and negative with exception of tinnitus and numbness/tingling  PMH:  Past Medical History:  Diagnosis Date  . Seizures (HCC)   . Stroke [facility])     PSH: No past surgical history on file.  Social History:  Social History   Socioeconomic History  . Marital status: Married    Spouse name: Not on file  . Number of children: Not on file  . Years of education: Not on file  . Highest education level: Not on file  Occupational History  . Not on file  Tobacco Use  . Smoking status: Never Smoker  . Smokeless tobacco: Never Used  Vaping Use  . Vaping Use: Never used  Substance and Sexual Activity  . Alcohol use: No  . Drug use: No  . Sexual activity: Never  Other Topics Concern  . Not on file  Social History Narrative  . Not on file   Social Determinants of Health   Financial Resource Strain:   . Difficulty of Paying Living Expenses:   Food Insecurity:   . Worried About Programme researcher, broadcasting/film/video in the Last Year:   . Barista in the Last Year:   Transportation Needs:   . Freight forwarder (Medical):   [name] Lack of Transportation (Non-Medical):   Physical Activity:   . Days of Exercise per Week:   . Minutes of Exercise per Session:   Stress:   . Feeling of Stress :   Social Connections:   . Frequency of Communication with Friends and Family:   . Frequency of Social Gatherings with Friends and Family:   . Attends Religious  Services:   . Active Member of Clubs or Organizations:   . Attends Banker Meetings:   [name] Marital Status:   Intimate Partner Violence:   . Fear of Current or Ex-Partner:   . Emotionally Abused:   [name] Physically Abused:   . Sexually Abused:     Family History:  Family History  Problem Relation Age of Onset  . Diabetes Sister     Medications:   Current Outpatient Medications on File Prior to Visit  Medication Sig Dispense Refill  . atorvastatin (LIPITOR) 80 MG tablet Take 1 tablet (80 mg total) by mouth daily at 6 PM. 90 tablet 3  . clopidogrel (PLAVIX) 75 MG tablet Take 1 tablet (75 mg total) by mouth daily. 90 tablet 3  . losartan (COZAAR) 25 MG tablet Take 1 tablet (25 mg total) by mouth at bedtime. 90 tablet 3  . Multiple Vitamin (MULTIVITAMIN WITH MINERALS) TABS tablet Take 1 tablet by mouth daily.    [name] PHENObarbital (LUMINAL) 100 MG  tablet Take 100 mg by mouth daily.   3   No current facility-administered medications on file prior to visit.    Allergies:  No Known Allergies    OBJECTIVE:  Physical Exam  Vitals:   03/13/20 1449  BP: 114/64  Pulse: 62  Weight: 151 lb 12.8 oz (68.9 kg)  Height: 5\' 5"  (1.651 m)   Body mass index is 25.26 kg/m. No exam data present  General: well developed, well nourished, pleasant Hispanic middle-age male, seated, in no evident distress   Neck: supple with no carotid or supraclavicular bruits Cardiovascular: regular rate and rhythm, no murmurs Vascular:  Normal pulses all extremities   Neurologic Exam Mental Status: Awake and fully alert.   Primarily Spanish-speaking but daughter declines speech or language difficulty.  Oriented to place and time. Recent and remote memory intact. Attention span, concentration and fund of knowledge appropriate. Mood and affect appropriate.  Cranial Nerves: Pupils equal, briskly reactive to light. Extraocular movements full without nystagmus. Visual fields full to confrontation. Normal  weber and rinne testing.  Hearing intact. Facial sensation intact. Face, tongue, palate moves normally and symmetrically.  Motor: Normal bulk and tone. Normal strength in all tested extremity muscles. Sensory.:  Decreased pinprick sensation bilateral hand distal fingertips Coordination: Rapid alternating movements normal in all extremities. Finger-to-nose and heel-to-shin performed accurately bilaterally. Gait and Station: Arises from chair without difficulty. Stance is normal. Gait demonstrates normal stride length and balance Reflexes: 1+ and symmetric. Toes downgoing.       ASSESSMENT: Randall Coffey is a 69 y.o. 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  Recommended max medical therapy with DAPT for 3 months as well as initiation of statin and following up with vascular surgery outpatient.  Vascular risk factors include HTN, HLD, new dx of DM, history of seizures, and right ICA high-grade soft plaque.       PLAN:  1. Right frontoparietal stroke:  a. No residual deficits b. continue clopidogrel 75 mg daily  and atorvastatin 80 mg daily for secondary stroke prevention.  c. Ensure close PCP follow-up for aggressive stroke risk factor management. 2. Right ICA plaque:  a. Monitored and managed by Dr. 01/04/2020 vascular surgery b. Continue on aspirin and statin and aggressive stroke risk factor management.   c. Carotid ultrasound in 11/2019 bilateral 1 to 39% stenosis and recommended 1 year follow-up  3. Paresthesias: a. Bilateral hand fingertip without radiating symptoms, pain or weakness b. Symptoms have been present over the past 5 to 6 years without worsening therefore no indication for further evaluation or treatment at this time c. Probable nerve damage from longstanding history from  working as a 12/2019 vs diabetic neuropathy d. Discussed importance of decreasing activity or functions which could worsen nerve damage as well as use of gloves while working with hands 4. Tinnitus: a. Advised to discuss further with PCP for possible need of referral to ENT for further evaluation 5. HTN:  a. BP goal<130/90.   b. Stable.   c. Managed by PCP 6. HLD:  a. LDL goal<70.   b. Continue atorvastatin 80 mg daily prescribed by PCP. 7. DMII:  a. A1c goal <7.0.   b. Managed by PCP.      Follow up in 6 months or call earlier if needed   I spent 40 minutes of face-to-face and  non-face-to-face time with patient, daughter and interpreter.  This included previsit chart review, lab review, study review, order entry, electronic health record documentation, patient education regarding recent stroke, importance of managing stroke risk factors, paresthesia complaints and answered all questions to patient and daughters satisfaction   Ihor AustinJessica McCue, [facility] PcGNP-BC  [facility] [address] [place], [place] [id number]  Phone [phone] Fax [phone] Note: This document was prepared with digital dictation and possible smart phrase technology. Any transcriptional errors that result from this process are unintentional.

## 2020-03-13 NOTE — Patient Instructions (Addendum)
Please discuss the ear ringing with your PCP for possible need of ENT referral/evaluation   Your finger tip numbness is likely due to your profession. It is recommended to avoid repetitive use of your finger tips and to use gloves to protect your fingers   Continue clopidogrel 75 mg daily  and lipitor 80mg  daily  for secondary stroke prevention  Continue to follow up with PCP regarding cholesterol, blood pressure and diabetes management  - please schedule follow up visit and have them check cholesterol levels to ensure satisfactory management  Maintain strict control of hypertension with blood pressure goal below 130/90, diabetes with hemoglobin A1c goal below 6.5% and cholesterol with LDL cholesterol (bad cholesterol) goal below 70 mg/dL.     Followup in the future with me in 6 months or call earlier if needed       Thank you for coming to see at [facility]. I hope we have been able to provide you high quality care today.  You may receive a patient satisfaction survey over the next few weeks. We would appreciate your feedback and comments so that we may continue to improve ourselves and the health of our patients.

## 2020-03-15 NOTE — Progress Notes (Signed)
I agree with the above plan 

## 2020-03-31 ENCOUNTER — Ambulatory Visit: Payer: Self-pay | Admitting: Family Medicine

## 2020-04-08 ENCOUNTER — Other Ambulatory Visit: Payer: Self-pay

## 2020-04-08 ENCOUNTER — Encounter: Payer: Self-pay | Admitting: Family Medicine

## 2020-04-08 ENCOUNTER — Ambulatory Visit (INDEPENDENT_AMBULATORY_CARE_PROVIDER_SITE_OTHER): Payer: Self-pay | Admitting: Family Medicine

## 2020-04-08 VITALS — BP 118/70 | HR 56 | Ht 65.0 in | Wt 150.0 lb

## 2020-04-08 DIAGNOSIS — E785 Hyperlipidemia, unspecified: Secondary | ICD-10-CM

## 2020-04-08 DIAGNOSIS — E1169 Type 2 diabetes mellitus with other specified complication: Secondary | ICD-10-CM

## 2020-04-08 DIAGNOSIS — E119 Type 2 diabetes mellitus without complications: Secondary | ICD-10-CM

## 2020-04-08 LAB — POCT GLYCOSYLATED HEMOGLOBIN (HGB A1C): HbA1c, POC (prediabetic range): 6 % (ref 5.7–6.4)

## 2020-04-08 NOTE — Patient Instructions (Signed)
It was great to see you!  Our plans for today:  -Your previous A1c was 6.6, today it is 6.0.  We are making no changes on your medications because your A1c is at goal! - We are checking your cholesterol today. I will let you know your results  We are checking some labs today, I will call you if they are abnormal will send you a MyChart message or a letter if they are normal.  If you do not hear about your labs in the next 2 weeks please let us know.  Take care and seek immediate care sooner if you develop any concerns.   Dr. [facility]

## 2020-04-08 NOTE — Assessment & Plan Note (Signed)
Assessment: History of hyperlipidemia with a history of stroke and diabetes.  Diabetes become well controlled and patient continues on atorvastatin 80 mg due to history of stroke. Plan: -Continue atorvastatin 80 mg -We will check lipid panel to assess for LDL with a goal of 70 or less -We will also assess triglycerides as previous triglycerides were near 400 but patient was not fasting at the time of that blood work and is fasting today.

## 2020-04-08 NOTE — Progress Notes (Signed)
° ° °  SUBJECTIVE:   CHIEF COMPLAINT / HPI:   Diabetes Patient is a 69 year old male presenting today for follow-up of diabetes.  Previous A1c was 6.6 in April.  Currently diet controlled and at goal. Previous A1C 6.6, currently 6.0.  Hyperlipidemia Patient on atorvastatin 80 mg due to history of stroke.  Previously followed with neurology who recommended following up with PCP for fasted labs as patient recently had triglycerides near 400 and has been on atorvastatin for about 6 months now with a goal of LDL less than 70.  Patient endorses taking his atorvastatin as prescribed.  [employer] Spanish interpreter used for duration of patient encounter.  PERTINENT  PMH / PSH: History of DM and previous stroke  OBJECTIVE:   BP 118/70    Pulse (!) 56    Ht 5\' 5"  (1.651 m)    Wt 150 lb (68 kg)    SpO2 96%    BMI 24.96 kg/m    General: NAD, pleasant, able to participate in exam Cardiac: RRR, no murmurs. Respiratory: CTAB, normal effort, No wheezes, rales or rhonchi Abdomen: Bowel sounds present, nontended Neuro: alert, no obvious focal deficits Psych: Normal affect and mood  ASSESSMENT/PLAN:   Hyperlipidemia associated with type 2 diabetes mellitus (HCC) Assessment: History of hyperlipidemia with a history of stroke and diabetes.  Diabetes become well controlled and patient continues on atorvastatin 80 mg due to history of stroke. Plan: -Continue atorvastatin 80 mg -We will check lipid panel to assess for LDL with a goal of 70 or less -We will also assess triglycerides as previous triglycerides were near 400 but patient was not fasting at the time of that blood work and is fasting today.  Diabetes mellitus, type 2 (HCC) Assessment: Well-controlled diabetes via diet.  A1c at goal.  Current A1c 6.0.  No current medications. Plan: -Continue diet controlled diabetes -Repeat A1c in around 6 months   Patient states that the phone number listed is for his daughter and he does give permission to  give his daughter his cholesterol results.  Patient's daughter was present during the encounter and agrees.  , DO [facility]

## 2020-04-08 NOTE — Assessment & Plan Note (Signed)
Assessment: Well-controlled diabetes via diet.  A1c at goal.  Current A1c 6.0.  No current medications. Plan: -Continue diet controlled diabetes -Repeat A1c in around 6 months

## 2020-04-09 LAB — LIPID PANEL
Chol/HDL Ratio: 2.9 ratio (ref 0.0–5.0)
Cholesterol, Total: 118 mg/dL (ref 100–199)
HDL: 41 mg/dL (ref >39)
LDL Chol Calc (NIH): 61 mg/dL (ref 0–99)
Triglycerides: 78 mg/dL (ref 0–149)
VLDL Cholesterol Cal: 16 mg/dL (ref 5–40)

## 2020-08-13 ENCOUNTER — Encounter (HOSPITAL_COMMUNITY): Payer: Self-pay

## 2020-08-13 ENCOUNTER — Ambulatory Visit (HOSPITAL_COMMUNITY)
Admission: EM | Admit: 2020-08-13 | Discharge: 2020-08-13 | Disposition: A | Discharge status: Home / Self Care | Payer: Self-pay | Attending: Urgent Care | Admitting: Urgent Care

## 2020-08-13 ENCOUNTER — Ambulatory Visit (INDEPENDENT_AMBULATORY_CARE_PROVIDER_SITE_OTHER): Payer: Self-pay

## 2020-08-13 DIAGNOSIS — S61211A Laceration without foreign body of left index finger without damage to nail, initial encounter: Secondary | ICD-10-CM

## 2020-08-13 DIAGNOSIS — W270XXA Contact with workbench tool, initial encounter: Secondary | ICD-10-CM

## 2020-08-13 DIAGNOSIS — S6992XA Unspecified injury of left wrist, hand and finger(s), initial encounter: Secondary | ICD-10-CM

## 2020-08-13 DIAGNOSIS — S68119A Complete traumatic metacarpophalangeal amputation of unspecified finger, initial encounter: Secondary | ICD-10-CM

## 2020-08-13 MED ORDER — TETANUS-DIPHTH-ACELL PERTUSSIS 5-2.5-18.5 LF-MCG/0.5 IM SUSY
0.5000 mL | PREFILLED_SYRINGE | Freq: Once | INTRAMUSCULAR | Status: AC
  Administered 2020-08-13: 0.5 mL via INTRAMUSCULAR

## 2020-08-13 MED ORDER — TETANUS-DIPHTH-ACELL PERTUSSIS 5-2.5-18.5 LF-MCG/0.5 IM SUSY
PREFILLED_SYRINGE | INTRAMUSCULAR | Status: AC
  Filled 2020-08-13: qty 0.5

## 2020-08-13 NOTE — Discharge Instructions (Signed)
No quiero que tome clopidogrel hoy ni Jueves. Puede tomarlo de [employer]. Cambie su gauza 2-3 veces al dia. Venga de nuevo el lunes para quitar sus puntados.

## 2020-08-13 NOTE — ED Triage Notes (Signed)
Pt presents with left index finger laceration from hand saw on Monday; pt had laceration sewn up at another medical facility but it continued to bleed profusely after he went home.

## 2020-08-13 NOTE — ED Provider Notes (Signed)
[facility]   MRN: [id number] DOB: 1951/06/15  Subjective:   Randall Coffey is a 70 y.o. male presenting for recheck on finger laceration he sustained 08/11/2020. Patient was using a hand saw and accidentally cut into his left index finger pad. He bled profusely and went to a different clinic. He had sutures placed across his finger pad and into the nail.  Needs to have his Tdap updated.  Denies fever, nausea, vomiting, redness, swelling, and pain, drainage of pus.  Patient is on clopidogrel for history of CVA.  Denies history of diabetes, states he is prediabetic.   Current Facility-Administered Medications:  .  Tdap (BOOSTRIX) injection 0.5 mL, 0.5 mL, Intramuscular, Once, Wallis Bamberg, PA-C  Current Outpatient Medications:  .  atorvastatin (LIPITOR) 80 MG tablet, Take 1 tablet (80 mg total) by mouth daily at 6 PM., Disp: 90 tablet, Rfl: 3 .  clopidogrel (PLAVIX) 75 MG tablet, Take 1 tablet (75 mg total) by mouth daily., Disp: 90 tablet, Rfl: 3 .  losartan (COZAAR) 25 MG tablet, Take 1 tablet (25 mg total) by mouth at bedtime., Disp: 90 tablet, Rfl: 3 .  Multiple Vitamin (MULTIVITAMIN WITH MINERALS) TABS tablet, Take 1 tablet by mouth daily., Disp: , Rfl:  .  PHENObarbital (LUMINAL) 100 MG tablet, Take 100 mg by mouth daily. , Disp: , Rfl: 3   No Known Allergies  Past Medical History:  Diagnosis Date  . Seizures (HCC)   . Stroke [facility])      History reviewed. No pertinent surgical history.  Family History  Problem Relation Age of Onset  . Diabetes Sister     Social History   Tobacco Use  . Smoking status: Never Smoker  . Smokeless tobacco: Never Used  Vaping Use  . Vaping Use: Never used  Substance Use Topics  . Alcohol use: No  . Drug use: No    ROS   Objective:   Vitals: BP 136/69 (BP Location: Right Arm)   Pulse 65   Temp 99 F (37.2 C) (Oral)   Resp 18   SpO2 95%   Physical Exam Constitutional:      General: He is not in  acute distress.    Appearance: Normal appearance. He is well-developed and normal weight. He is not ill-appearing, toxic-appearing or diaphoretic.  HENT:     Head: Normocephalic and atraumatic.     Right Ear: External ear normal.     Left Ear: External ear normal.     Nose: Nose normal.     Mouth/Throat:     Pharynx: Oropharynx is clear.  Eyes:     General: No scleral icterus.       Right eye: No discharge.        Left eye: No discharge.     Extraocular Movements: Extraocular movements intact.     Pupils: Pupils are equal, round, and reactive to light.  Cardiovascular:     Rate and Rhythm: Normal rate.  Pulmonary:     Effort: Pulmonary effort is normal.  Musculoskeletal:       Hands:     Cervical back: Normal range of motion.  Neurological:     Mental Status: He is alert and oriented to person, place, and time.  Psychiatric:        Mood and Affect: Mood normal.        Behavior: Behavior normal.        Thought Content: Thought content normal.  Judgment: Judgment normal.     DG Finger Index Left  Result Date: 08/13/2020 CLINICAL DATA:  Left index finger laceration from saw. EXAM: LEFT INDEX FINGER 2+V COMPARISON:  None FINDINGS: Fine bone detail is diminished due to overlying bandage material. Apparent soft tissue amputation off the tip of the second distal phalanx. Tiny osteophytic fragment is identified adjacent to the tuft of the second distal phalanx. No dislocations identified. Moderate degenerative changes are noted at the second PIP and D IP joints. IMPRESSION: 1. 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 Tiny osteophytic fragment adjacent to the tuft of the second distal phalanx may represent a small fracture. 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 Electronically Signed   By: Signa Kell M.D.   On: 08/13/2020 11:57    Assessment and Plan :   PDMP not reviewed this encounter.  1. Amputation finger, initial encounter   2. Laceration  of left index finger without foreign body without damage to nail, initial encounter   3. Finger injury, left, initial encounter     Case reviewed with Dr. Leonides Grills, consulted PA-Jeffery.  Patient is to hold Plavix for 2 days.  Discussed wound care.  Recommended changing pressure dressings 2-3 times a day.  Tdap updated.  Follow-up in 5 days or sooner if signs of infection develop. Counseled patient on potential for adverse effects with medications prescribed/recommended today, ER and return-to-clinic precautions discussed, patient verbalized understanding.     Wallis Bamberg, PA-C 08/13/20 1212

## 2020-08-20 ENCOUNTER — Ambulatory Visit (HOSPITAL_COMMUNITY)
Admission: EM | Admit: 2020-08-20 | Discharge: 2020-08-20 | Disposition: A | Discharge status: Home / Self Care | Payer: Self-pay

## 2020-08-20 ENCOUNTER — Encounter (HOSPITAL_COMMUNITY): Payer: Self-pay | Admitting: Emergency Medicine

## 2020-08-20 NOTE — ED Triage Notes (Signed)
Pt presents for suture removal. Denies any pains for signs of infection to the site.

## 2020-09-18 ENCOUNTER — Ambulatory Visit: Payer: Self-pay | Admitting: Adult Health

## 2020-09-18 ENCOUNTER — Encounter: Payer: Self-pay | Admitting: Adult Health

## 2020-09-18 VITALS — BP 135/74 | HR 59 | Ht 64.0 in | Wt 154.0 lb

## 2020-09-18 DIAGNOSIS — I639 Cerebral infarction, unspecified: Secondary | ICD-10-CM

## 2020-09-18 DIAGNOSIS — R202 Paresthesia of skin: Secondary | ICD-10-CM

## 2020-09-18 NOTE — Progress Notes (Signed)
I agree with the above plan 

## 2020-09-18 NOTE — Patient Instructions (Addendum)
Continue clopidogrel 75 mg daily  and atorvastatin 80 mg daily for secondary stroke prevention  Follow up with Dr. Chestine Spore in April for repeat carotid monitoring   Continue to follow up with PCP regarding cholesterol and blood pressure management  Maintain strict control of hypertension with blood pressure goal below 130/90 and cholesterol with LDL cholesterol (bad cholesterol) goal below 70 mg/dL.    Overall stable from stroke standpoint and recommend follow up as needed     Thank you for coming to see Randall Coffey at [facility]. I hope we have been able to provide you high quality care today.  You may receive a patient satisfaction survey over the next few weeks. We would appreciate your feedback and comments so that we may continue to improve ourselves and the health of our patients.     Plan de alimentacin despus de un accidente cerebrovascular Eating Plan After Stroke Un accidente cerebrovascular produce dao en las clulas del cerebro, lo cual puede afectar la capacidad de caminar, hablar e incluso comer. El impacto de un accidente cerebrovascular es distinto en cada persona, y lo mismo ocurre para su recuperacin. Un buen plan de nutricin es importante para la recuperacin. Tambin puede reducir [employer] de sufrir otro accidente cerebrovascular. Si tiene dificultad para Product manager y Museum/gallery curator, un nutricionista o su equipo de atencin especializado en accidentes cerebrovasculares puede ayudar para que usted pueda disfrutar de comer alimentos saludables. Cules son algunos consejos para seguir este plan? Lea las etiquetas de los alimentos  Elija alimentos que contengan menos de 330miligramos(mg) de sodio por porcin. Limite el consumo de sodio a menos de 1500mg  por .  Evite los alimentos que contengan grasas saturadas y grasas trans.  Elija alimentos que tengan bajo contenido de [place]. Limite la cantidad de colesterol que come por da a menos de  200mg .  Elija alimentos que sean ricos en fibra. Consuma entre 20 y 30gramos (g) de [place].  Evite los alimentos con . Revise la etiqueta de los alimentos para ver si contienen ingredientes como azcar, jarabe de maz, miel, fructosa, melaza y [employer] de caa. Al ir de compras  En el supermercado, compre la Engineer, mining de los [place] reas cercanas a las paredes del edificio. Esto incluye: ? [employer] y verduras frescas. ? Cereales, frijoles, frutos secos y semillas. ? Frutos de [employer], aves, carnes magras y [place]. ? Productos lcteos descremados.  Compre ingredientes frescos en lugar de alimentos preenvasados.  Compre frutas y verduras de estacin frescas en mercados de granjeros locales.  Compre frutas y verduras congeladas en bolsas hermticas. Al cocinar  Prepare los alimentos con muy poca sal. Use hierbas o especias sin sal en su lugar.  Cocine con aceites cardiosaludables, como oliva, aguacate, canola, soja o girasol.  Evite frer los alimentos. En su lugar, hornee, cocine a la parrilla o ase los alimentos.  Retire la grasa visible y la piel de las carnes rojas y de ave antes de comer.  Modifique la textura de los alimentos como se lo haya indicado el mdico. Planificacin de las comidas  Consuma una amplia variedad de frutas y verduras coloridas. Asegrese de [employer] mitad del plato con frutas y verduras en cada comida.  Consuma frutas y verduras con alto contenido de potasio, por ejemplo: ? Manzanas, bananas, naranjas y meln. ? Camotes, espinaca, calabacn y tomates.  Coma pescados que contengan grasas cardiosaludables (grasas omega-3) al [place] veces por semana. Estos incluyen el salmn,  el atn, la caballa y las sardinas.  Consuma alimentos de origen vegetal ricos en grasas omega-3, como las semillas de lino y las nueces. Agregue estos a los cereales, el yogur o los platos de pastas.  Consuma varias porciones de alimentos  ricos en fibra cada da, como frutas, verduras, cereales integrales y frijoles.  No coloque la sal en la mesa para las comidas.  Cuando coma en un restaurante: ? Pregntele al [employer] las opciones de alimentos sin sal o con bajo contenido de sal. ? Evite las comidas fritas. Busque ingredientes en el men hechos a la parrilla, al vapor, a las brasas o asados. ? Pregunte si su comida puede prepararse sin mantequilla. ? Pida que los condimentos, como aderezos para [place], jugos de carne o salsas, se sirvan al [place].  Si tiene dificultad para tragar: ? Elija los alimentos que sean ms blandos y ms fciles de Product manager y Engineer, manufacturing. ? Corte los [employer] en trozos pequeos y [place] bien antes de Engineer, manufacturing. ? Espese los lquidos segn las indicaciones de su mdico o nutricionista. ? Hgale saber a su mdico si su afeccin no mejora con el tiempo. Es posible que Programmer, multimedia a un fonoaudilogo para volver a Theatre manager que se usan para comer. Recomendaciones generales  Involucre a sus familiares y amigos en su recuperacin, de ser posible. Puede ser til extender la hora de la comida y planificar comidas que incluyen alimentos que todas las personas de la familia puedan comer.  Cepllese los dientes con una pasta dental con flor dos veces por da y psese el hilo dental una vez por da. Mantener la boca limpia puede ayudarlo a tragar y tambin puede ayudar a su apetito.  Beba suficiente agua todos los [address]  [address] para [address] la orina de color amarillo plido. Si es necesario, establezca recordatorios o pida a sus familiares que lo ayuden a Chief Strategy Officer.  Limite el consumo de alcohol a no ms de por da si es mujer y no est [place], y a por da si es hombre. Una medida equivale a 12oz de Financial controller, 5oz de vino o 1oz de bebidas alcohlicas de alta graduacin.   Resumen  Seguir [employer] plan de alimentacin puede ayudarlo en la recuperacin de su accidente  cerebrovascular y puede disminuir su riesgo de sufrir otro.  Informe a su mdico si tiene problemas para tragar. Tal vez deba consultar a un fonoaudilogo. Esta informacin no tiene Theme park manager el consejo del mdico. Asegrese de hacerle al mdico cualquier pregunta que tenga. Document Revised: 10/29/2017 Document Reviewed: 10/29/2017 Elsevier Patient Education  2021 [employer].

## 2020-09-18 NOTE — Progress Notes (Signed)
[facility] [address] [place].  [address] [phone]       STROKE FOLLOW UP NOTE  [name] Date of Birth:  08-Feb-1951 Medical Record Number:  [id number]   Reason for Referral: stroke follow up    SUBJECTIVE:   CHIEF COMPLAINT:  Chief Complaint  Patient presents with  . Follow-up    RM 68 with daughter in law Randall Coffey) & interpreter Pt is well, some hand tingling     HPI:   Today, 09/18/2020, returns for 69-month stroke follow-up accompanied by daughter and interpreter.  No residual stroke deficit and denies new or recurring stroke/TIA symptoms  Reports compliance with Plavix and atorvastatin 80 mg daily Blood pressure today 135/74 Lipid panel 04/2020 LDL 61 A1c 04/2020 6.0  He continues to c/o b/l hand numbness/tlingling which can worsen with hand movement or laying on certain side This has not worsened nor interferes with daily activity   History provided for reference purposes only Update 03/13/2020 JM: [name] returns for stroke follow-up accompanied by his daughter and interpreter. Complains of left hand fingertip pins/needles sensation intermittently throughout the day; occassionally present in right fingertips.  He does report having symptoms for the past 5-6 years. Denies pain or weakness or difficulty functioning with hands.  Denies sensation into wrist or arm.  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  He also continues to complain of intermittent bilateral tinnitus which was discussed at prior visit He denies residual deficits from his stroke such as left-sided weakness or dysarthria Denies new or reoccurring stroke/TIA symptoms Completed 3 months DAPT and remains on Plavix without bleeding or bruising.  Continues on atorvastatin without myalgias.  Blood pressure today 114/64.  Continues to follow with PCP for HTN and HLD management. No further  concerns at this time  Update 11/28/2019: [name] is being seen for hospital follow-up accompanied by interpreter and daughter.  Daughter called requesting sooner evaluation due to new concerns of constant ear ringing and intermittent episodes of left hand numbness/tingling.  Numbness/tingling present since stroke admission with ear ringing started approximately 1 week after discharge.  Not bothersome and reports humming noise bilaterally.  Denies hearing loss, fullness or pain.  Noise does not change quality with positional changes or with increased activity and denies pulsating.  Denies any new onset symptoms or any other associated symptoms.  He does endorse increased anxiety post stroke.  He has returned back to most prior activities without great difficulty.  He continues on DAPT with aspirin 325 mg daily and clopidogrel 75 mg daily without bleeding or bruising.  Continues on atorvastatin 80 mg daily without myalgias.  Blood pressure today 132/84.  Has scheduled follow-up visit with vascular surgery Dr. Chestine Spore on 12/04/2019.  No further concerns at this time.  Stroke admission 11/03/2019: [name] is a 70 y.o. male with history of seizures  who presented on 11/03/2019 with L sided weakness/numbness and difficulty speaking.  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  Evaluated by vascular surgery and recommended maximize medical treatment with DAPT for 3 months then Plavix alone and follow-up with VVS outpatient.  HTN stabilized during admission with BP goal normotensive range.  LDL 197 and initiated atorvastatin 80 mg daily.  New diagnosis of DM with A1c 6.6 and recommend close PCP follow-up.  Other stroke risk factors include advanced age but no prior  history of stroke.  Other active problems include history of seizures on phenobarbital, left side back pain with sciatica and hyperkalemia.  No  residual deficits and was discharged home in stable condition without therapy needs.  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 Nonspecific nodule R frontal lobe. Sinus dz.   MRI  Small R frontoparietal infarcts involving sensorimotor gyri  CTA head & neck no LVO. B ICA bifurcation atherosclerosis w/o stenosis. High risk soft plaques at right ICA although no significant stenosis   Carotid Doppler  B ICA 1-39% stenosis, VAs antegrade   2D Echo EF 60-65%  LDL 197  HgbA1c 6.6  Lovenox 40 mg sq daily for VTE prophylaxis  aspirin 81 mg daily prior to admission, now on aspirin 325 mg daily and clopidogrel 75 mg daily following plavix load. Continue DAPT x 3 months then plavix alone   Therapy recommendations:  No PT or OT needs  Disposition:  Return home       ROS:   14 system review of systems performed and negative with exception of tinnitus and numbness/tingling  PMH:  Past Medical History:  Diagnosis Date  . Seizures (HCC)   . Stroke [facility])     PSH: History reviewed. No pertinent surgical history.  Social History:  Social History   Socioeconomic History  . Marital status: Married    Spouse name: Not on file  . Number of children: Not on file  . Years of education: Not on file  . Highest education level: Not on file  Occupational History  . Not on file  Tobacco Use  . Smoking status: Never Smoker  . Smokeless tobacco: Never Used  Vaping Use  . Vaping Use: Never used  Substance and Sexual Activity  . Alcohol use: No  . Drug use: No  . Sexual activity: Never  Other Topics Concern  . Not on file  Social History Narrative  . Not on file   Social Determinants of Health   Financial Resource Strain: Not on file  Food Insecurity: Not on file  Transportation Needs: Not on file  Physical Activity: Not on file  Stress: Not on file  Social Connections: Not on  file  Intimate Partner Violence: Not on file    Family History:  Family History  Problem Relation Age of Onset  . Diabetes Sister     Medications:   Current Outpatient Medications on File Prior to Visit  Medication Sig Dispense Refill  . atorvastatin (LIPITOR) 80 MG tablet Take 1 tablet (80 mg total) by mouth daily at 6 PM. 90 tablet 3  . clopidogrel (PLAVIX) 75 MG tablet Take 1 tablet (75 mg total) by mouth daily. 90 tablet 3  . losartan (COZAAR) 25 MG tablet Take 1 tablet (25 mg total) by mouth at bedtime. 90 tablet 3  . Multiple Vitamin (MULTIVITAMIN WITH MINERALS) TABS tablet Take 1 tablet by mouth daily.    [name] PHENObarbital (LUMINAL) 100 MG tablet Take 100 mg by mouth daily.   3   No current facility-administered medications on file prior to visit.    Allergies:  No Known Allergies    OBJECTIVE:  Physical Exam  Vitals:   09/18/20 1030  BP: 135/74  Pulse: (!) 59  Weight: 154 lb (69.9 kg)  Height: 5\' 4"  (1.626 m)   Body mass index is 26.43 kg/m. No exam data present  General: well  developed, well nourished, pleasant Hispanic middle-age male, seated, in no evident distress   Neck: supple with no carotid or supraclavicular bruits Cardiovascular: regular rate and rhythm, no murmurs Vascular:  Normal pulses all extremities   Neurologic Exam Mental Status: Awake and fully alert.   Primarily Spanish-speaking but daughter declines speech or language difficulty.  Oriented to place and time. Recent and remote memory intact. Attention span, concentration and fund of knowledge appropriate. Mood and affect appropriate.  Cranial Nerves: Pupils equal, briskly reactive to light. Extraocular movements full without nystagmus. Visual fields full to confrontation. Normal weber and rinne testing.  Hearing intact. Facial sensation intact. Face, tongue, palate moves normally and symmetrically.  Motor: Normal bulk and tone. Normal strength in all tested extremity muscles. Sensory.:   Decreased pinprick sensation bilateral hand distal fingertips Coordination: Rapid alternating movements normal in all extremities. Finger-to-nose and heel-to-shin performed accurately bilaterally. Gait and Station: Arises from chair without difficulty. Stance is normal. Gait demonstrates normal stride length and balance Reflexes: 1+ and symmetric. Toes downgoing.       ASSESSMENT: Randall Coffey is a 70 y.o. 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  Recommended max medical therapy with DAPT for 3 months as well as initiation of statin and following up with vascular surgery outpatient.  Vascular risk factors include HTN, HLD, new dx of DM, history of seizures, and right ICA high-grade soft plaque.       PLAN:  1. Right frontoparietal stroke:  a. No residual deficits b. continue clopidogrel 75 mg daily  and atorvastatin 80 mg daily for secondary stroke prevention.  c. Ensure close PCP follow-up for aggressive stroke risk factor management. d. HTN: BP goal<130/90.  Stable today.  Monitored by PCP e. HLD: LDL goal<70.  LDL 61 (04/2020) on atorvastatin 80 mg daily per PCP f. DMII:  A1c goal <7.0.  A1c 6.0 (04/2020) 2. Right ICA plaque:  a. Monitored and managed by Dr. Chestine Spore vascular surgery b. Continue on aspirin and statin and aggressive stroke risk factor management.   c. Carotid ultrasound in 11/2019 bilateral 1 to 39% stenosis with plans on repeat ultrasound 10/2020 3. B/l hand Paresthesias: a. Bilateral hand fingertip without radiating symptoms, pain or weakness -does not interfere with daily activity and has been stable over the past 5 to 6 years b. Discussed possibility of EMG/NCV for further evaluation but declines at this time   Overall stable from stroke standpoint and recommend  follow-up on an as-needed basis  CC:  [facility] provider: Dr. Junious Silk, Ryan, DO    I spent 30 minutes of face-to-face and non-face-to-face time with patient, daughter and interpreter.  This included previsit chart review, lab review, study review, order entry, electronic health record documentation, patient education regarding prior stroke including etiology, importance of managing stroke risk factors, paresthesia complaints with possible etiologies and further evaluation, and answered all other  questions to patient and daughters satisfaction   Ihor Austin, [facility]  [facility] [address] [place], [place] [address]  Phone [phone] Fax [phone] Note: This document was prepared with digital dictation and possible smart phrase technology. Any transcriptional errors that result from this process are unintentional.

## 2020-12-02 ENCOUNTER — Other Ambulatory Visit: Payer: Self-pay | Admitting: Family Medicine

## 2020-12-02 ENCOUNTER — Other Ambulatory Visit: Payer: Self-pay | Admitting: Adult Health

## 2020-12-03 ENCOUNTER — Other Ambulatory Visit: Payer: Self-pay

## 2020-12-03 MED ORDER — CLOPIDOGREL BISULFATE 75 MG PO TABS: 1.0000 | ORAL_TABLET | Freq: Every day | ORAL | 0 refills | Status: DC

## 2020-12-03 MED ORDER — ATORVASTATIN CALCIUM 80 MG PO TABS: ORAL_TABLET | ORAL | 0 refills | Status: DC

## 2020-12-09 ENCOUNTER — Ambulatory Visit (INDEPENDENT_AMBULATORY_CARE_PROVIDER_SITE_OTHER): Payer: Self-pay | Admitting: Vascular Surgery

## 2020-12-09 ENCOUNTER — Ambulatory Visit (HOSPITAL_COMMUNITY)
Admission: RE | Admit: 2020-12-09 | Discharge: 2020-12-09 | Disposition: A | Discharge status: Home / Self Care | Payer: Self-pay | Source: Ambulatory Visit | Attending: Vascular Surgery | Admitting: Vascular Surgery

## 2020-12-09 ENCOUNTER — Encounter: Payer: Self-pay | Admitting: Vascular Surgery

## 2020-12-09 ENCOUNTER — Other Ambulatory Visit: Payer: Self-pay

## 2020-12-09 VITALS — BP 125/73 | HR 56 | Resp 16 | Ht 65.0 in | Wt 151.0 lb

## 2020-12-09 DIAGNOSIS — I6523 Occlusion and stenosis of bilateral carotid arteries: Secondary | ICD-10-CM

## 2020-12-09 NOTE — Progress Notes (Signed)
Patient name: Randall Coffey MRN: [id number] DOB: 03/07/1951 Sex: male  REASON FOR VISIT: 1 year follow-up carotid artery disease surveillance  HPI: Randall Coffey is a 70 y.o. male who presents for 1 year follow-up of his carotid artery disease.  He is here with his daughter-in-law and she states he has had no neurologic events over the last year.  He feels that he is at his neurologic baseline.  He does not smoke.  He did stop the aspirin and is only taking Plavix plus statin per his neurologist.  He was initially seen in the hospital with left upper extremity weakness and ultimately found to have small right brain stroke on MRI.  Ultimately on further evaluation CT showed less than 50% stenosis and duplex confirmed a 1 to 39% stenosis in the right ICA.  We then recommended medical management with aspirin statin at discharge as well as Plavix.    Past Medical History:  Diagnosis Date  . Seizures (HCC)   . Stroke [facility]     History reviewed. No pertinent surgical history.  Family History  Problem Relation Age of Onset  . Diabetes Sister     SOCIAL HISTORY: Social History   Tobacco Use  . Smoking status: Never Smoker  . Smokeless tobacco: Never Used  Substance Use Topics  . Alcohol use: No    No Known Allergies  Current Outpatient Medications  Medication Sig Dispense Refill  . atorvastatin (LIPITOR) 80 MG tablet TAKE ONE TABLET BY MOUTH DAILY AT 6:00 IN THE EVENING 90 tablet 0  . clopidogrel (PLAVIX) 75 MG tablet Take 1 tablet (75 mg total) by mouth daily. 90 tablet 0  . losartan (COZAAR) 25 MG tablet TAKE ONE TABLET BY MOUTH AT BEDTIME 90 tablet 3  . Multiple Vitamin (MULTIVITAMIN WITH MINERALS) TABS tablet Take 1 tablet by mouth daily.    [name] PHENObarbital (LUMINAL) 100 MG tablet Take 100 mg by mouth daily.   3   No current facility-administered medications for this visit.    REVIEW OF SYSTEMS:  [X]  denotes positive finding, [ ]  denotes negative  finding Cardiac  Comments:  Chest pain or chest pressure:    Shortness of breath upon exertion:    Short of breath when lying flat:    Irregular heart rhythm:        Vascular    Pain in calf, thigh, or hip brought on by ambulation:    Pain in feet at night that wakes you up from your sleep:     Blood clot in your veins:    Leg swelling:         Pulmonary    Oxygen at home:    Productive cough:     Wheezing:         Neurologic    Sudden weakness in arms or legs:     Sudden numbness in arms or legs:     Sudden onset of difficulty speaking or slurred speech:    Temporary loss of vision in one eye:     Problems with dizziness:         Gastrointestinal    Blood in stool:     Vomited blood:         Genitourinary    Burning when urinating:     Blood in urine:        Psychiatric    Major depression:         Hematologic    Bleeding problems:    Problems with blood  clotting too easily:        Skin    Rashes or ulcers:        Constitutional    Fever or chills:      PHYSICAL EXAM: Vitals:   12/09/20 1201 12/09/20 1205  BP: 136/79 125/73  Pulse: (!) 57 (!) 56  Resp: 16   SpO2: 94%   Weight: 151 lb (68.5 kg)   Height: 5\' 5"  (1.651 m)     GENERAL: The patient is a well-nourished male, in no acute distress. The vital signs are documented above. CARDIAC: There is a regular rate and rhythm.  PULMONARY: There is good air exchange bilaterally without wheezing or rales. ABDOMEN: Soft and non-tender with normal pitched bowel sounds.  MUSCULOSKELETAL: There are no major deformities or cyanosis. NEUROLOGIC: No focal weakness or paresthesias are detected.  CN II through XII grossly intact. SKIN: There are no ulcers or rashes noted. PSYCHIATRIC: The patient has a normal affect.  DATA:   Carotid duplex today shows some mild progression of disease in the right ICA now with 40 to 59% stenosis minimal 1 to 39% stenosis in the left ICA  Assessment/Plan:  70 year old male  that presents for 1 year follow-up of his carotid artery disease.  As previously noted we saw him in the hospital in 11/2019 following a small right brain stroke- with a right ICA stenosis less than 50% by CT and 1 to 39% stenosis by duplex.  Ultimately we elected medical management with dual antiplatelet therapy.    On 1 year follow-up today he is still doing well.  Has had no additional neurologic events.  He is now taking Plavix and statin and apparently his aspirin was stopped by his neurologist.  Overall pleased with his progress.  He had some mild progression now with a 40 to 59% stenosis in the right ICA but minimal disease on the left.  I again discussed there is no indication for surgical intervention.  Follow-up again in a year with carotid duplex for continued surveillance.   12/2019, MD [facility] Office: [phone]

## 2021-05-23 IMAGING — CT CT HEAD W/O CM
4 series · 15 of 47 positions shown, 17 images · non-contrast
Comparison: None.

CLINICAL DATA: Left facial droop and left arm numbness.

EXAM:
CT HEAD WITHOUT CONTRAST
TECHNIQUE: Contiguous axial images were obtained from the base of the skull
through the vertex without intravenous contrast.

[Series 3: head without · axial · non-contrast · 0.47mm/px · z∈[-41,+79]mm · 7 of 34 slices shown, 9 images]
[im 5/34  brain]
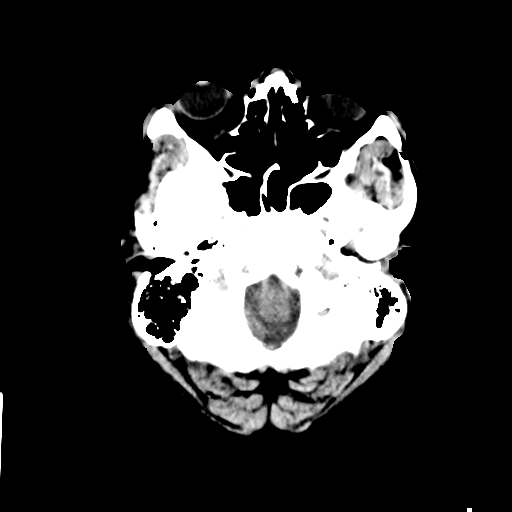
[im 5/34  bone]
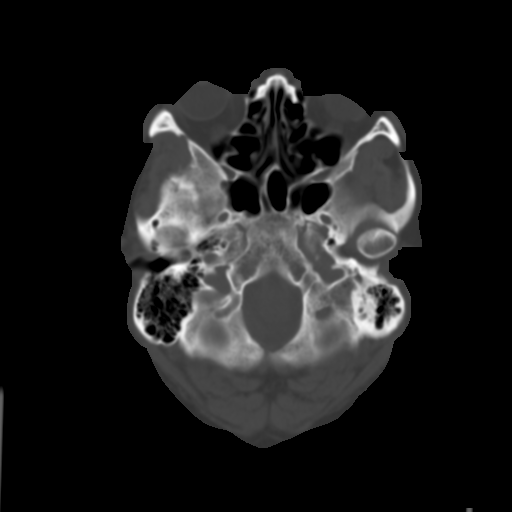
[im 9/34  brain]
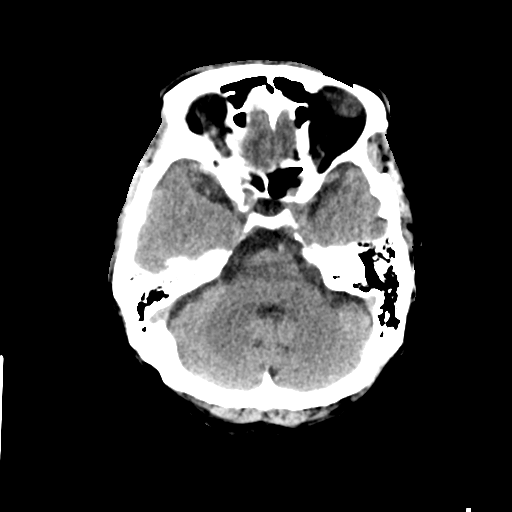
[im 13/34  brain]
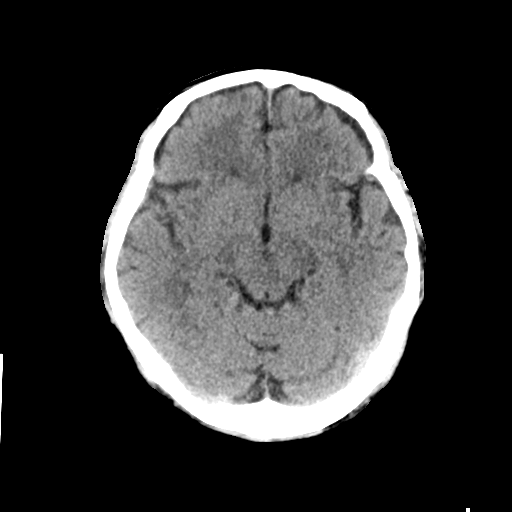
[im 17/34  brain]
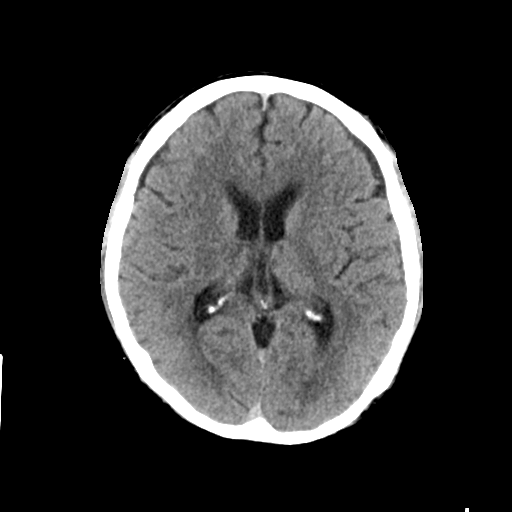
[im 21/34  brain]
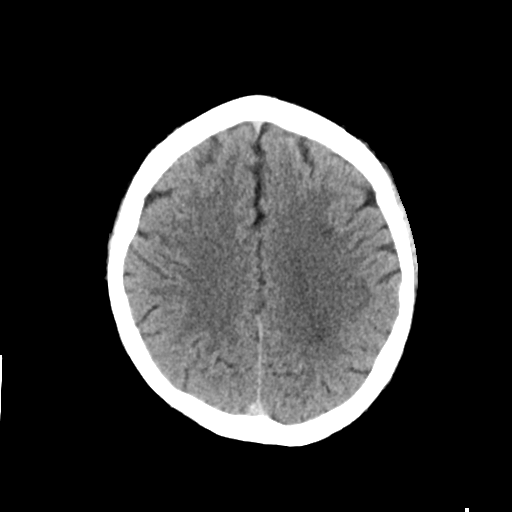
[im 21/34  bone]
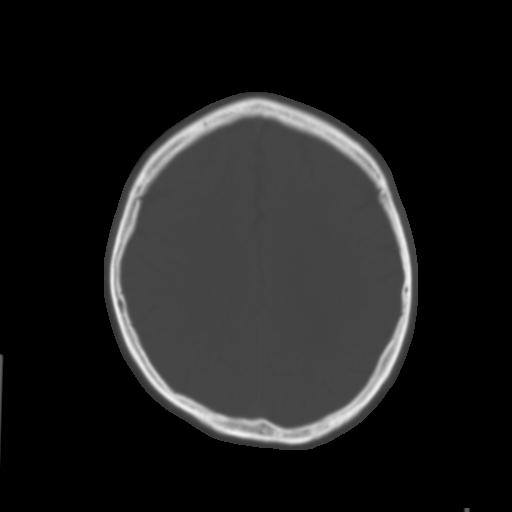
[im 25/34  brain]
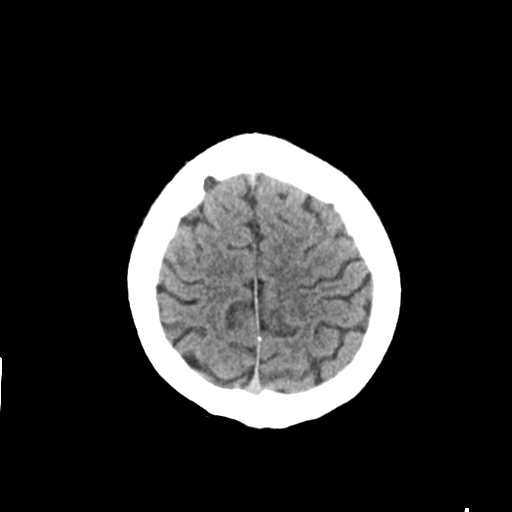
[im 29/34  brain]
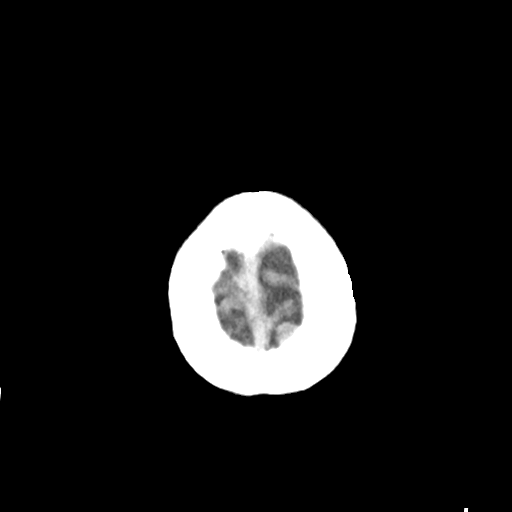

[Series 4: head bone · axial · 0.47mm/px · z∈[-45,-29]mm · 2 of 84 slices shown]
[im 9/84  bone]
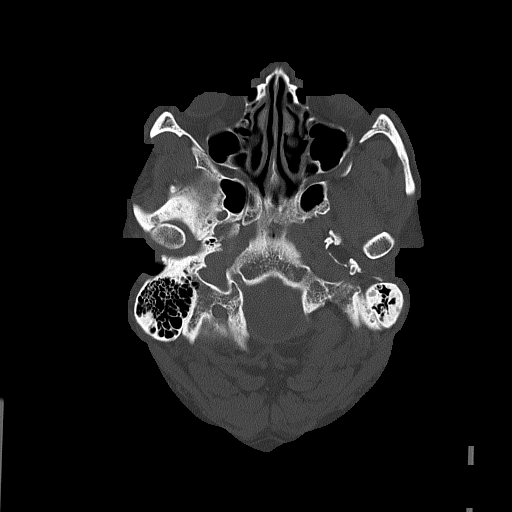
[im 17/84  bone]
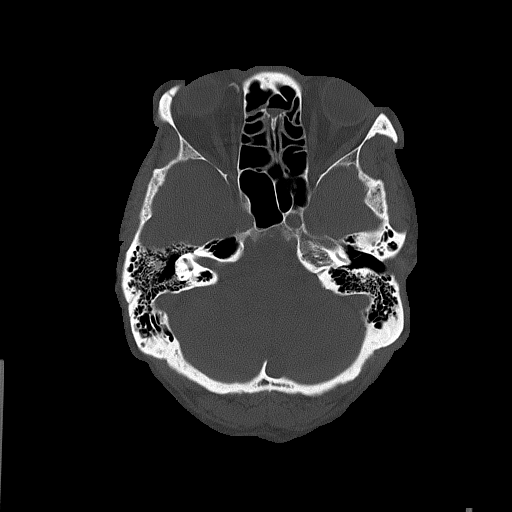

[Series 5: head without cor · coronal · non-contrast · 0.33mm/px · 3 of 67 slices shown]
[im 23/67  brain]
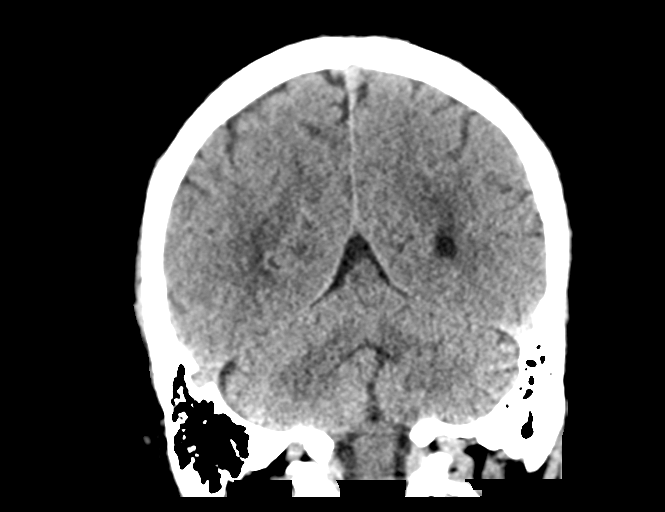
[im 30/67  brain]
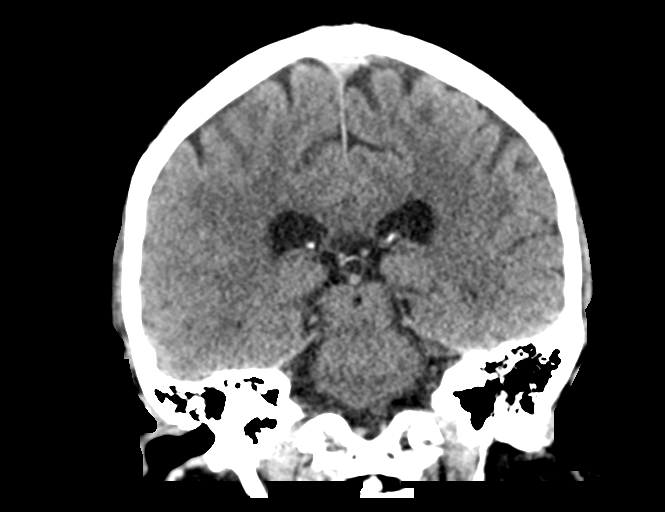
[im 37/67  brain]
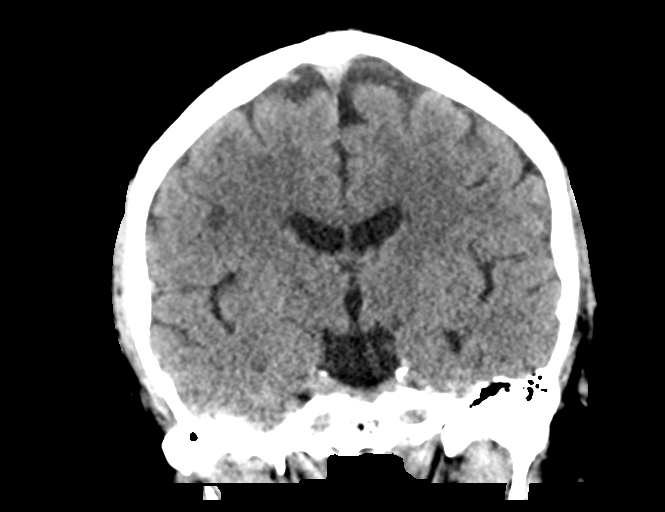

[Series 6: head without sag · sagittal · non-contrast · 0.33mm/px · 3 of 67 slices shown]
[im 23/67  brain]
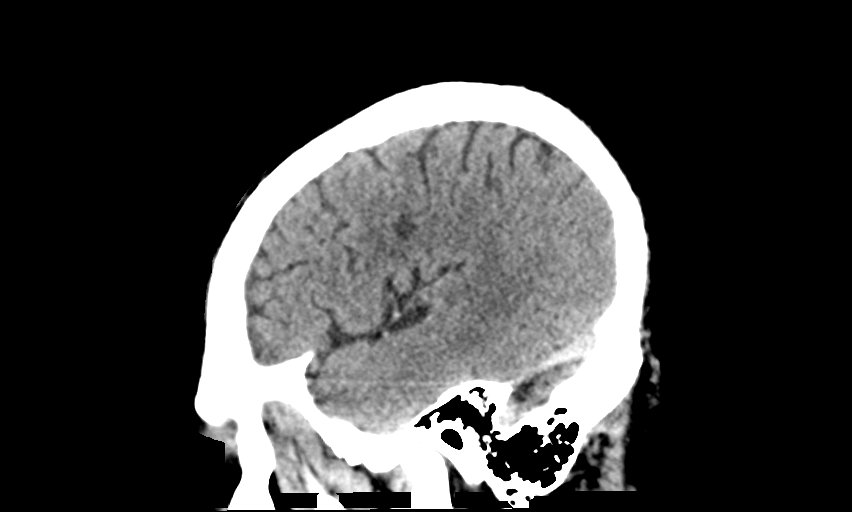
[im 34/67  brain]
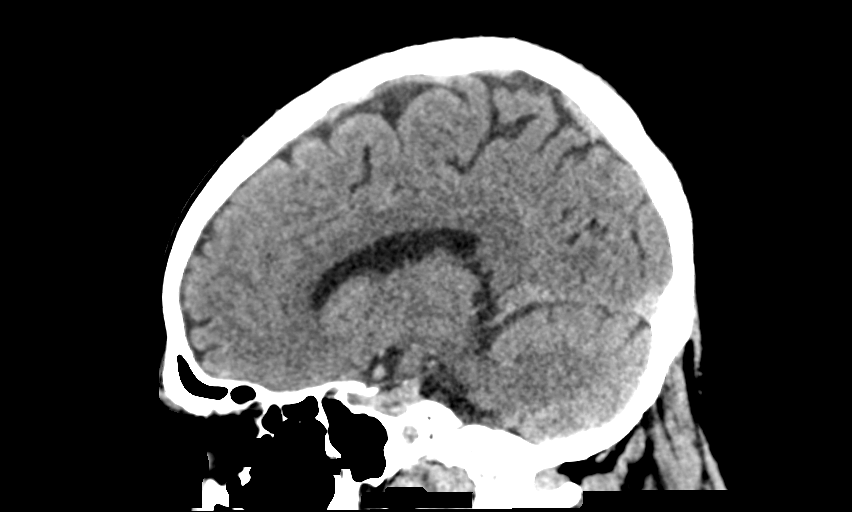
[im 45/67  brain]
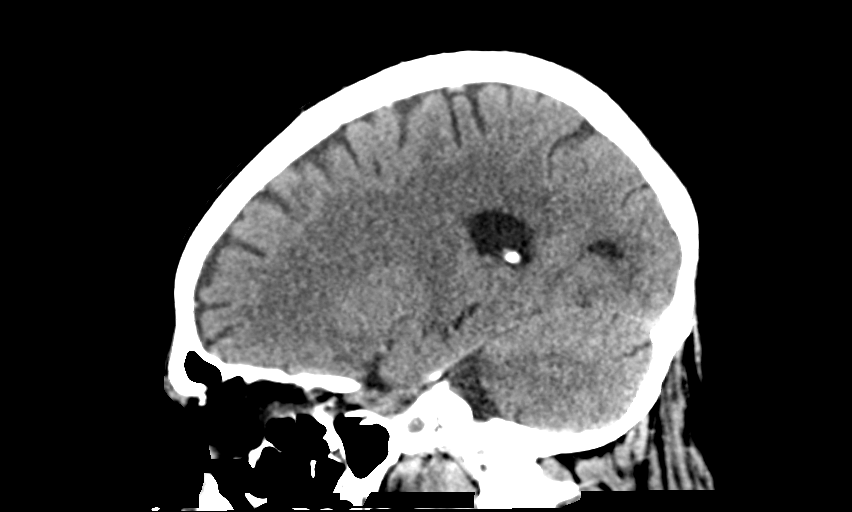

[15 of 47 positions shown; findings below may reference images not displayed]

FINDINGS: Brain: No definite evidence of acute infarction, hemorrhage,
hydrocephalus, extra-axial collection or mass lesion/mass effect.
Possible 1 cm mixed echogenicity nodule at the gray white matter
interface of the right frontal lobe

Vascular: No hyperdense vessel or unexpected calcification.

Skull: Normal. Negative for fracture or focal lesion.

Sinuses/Orbits: Mild mucosal thickening of the ethmoid sinuses.

Other: None.
IMPRESSION: No definite evidence of acute infarction. However there is a
nonspecific 1.7 cm nodule at the gray-white matter interface of the
right frontal lobe with uncertain etiology. If clinically warranted,
further evaluation with brain MRI with contrast may be considered.

Mild ethmoid sinusitis.

## 2021-05-23 IMAGING — MR MR HEAD WO/W CM
14 of 16 series · 40 of 48 positions shown · IV contrast (gadavist)
Comparison: Earlier same day

CLINICAL DATA: Abnormal CT

EXAM:
MRI HEAD WITHOUT AND WITH CONTRAST
TECHNIQUE: Multiplanar, multiecho pulse sequences of the brain and surrounding
structures were obtained without and with intravenous contrast.
CONTRAST:  7.4mL GADAVIST GADOBUTROL 1 MMOL/ML IV SOLN

[Series 5: DWI · axial · 3.0mm · 0.88mm/px · z∈[-102,+35]mm · 5 of 94 slices shown (1 of 4)]
[im 1/94]
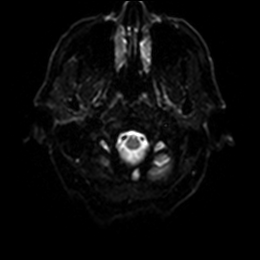
[im 24/94]
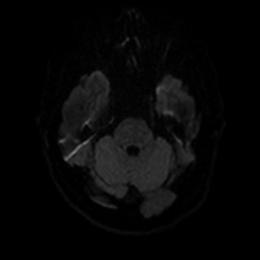
[im 47/94]
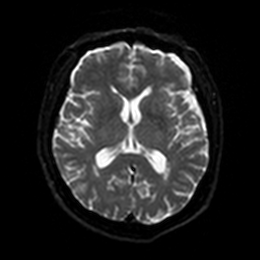
[im 70/94]
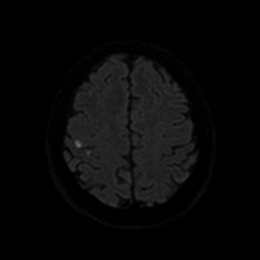
[im 94/94]
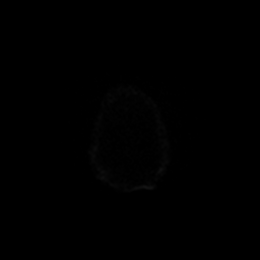

[Series 6: DWI · axial · 3.0mm · 0.88mm/px · z∈[-102,+35]mm · 2 of 47 slices shown (2 of 4)]
[im 1/47]
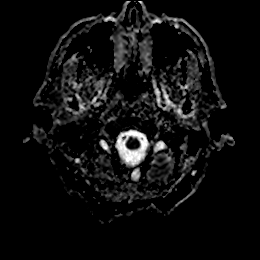
[im 47/47]
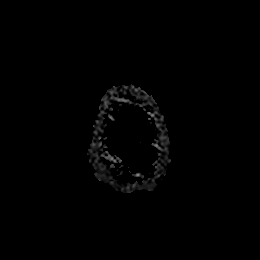

[Series 7: DWI · coronal · 4.0mm · 0.88mm/px · 4 of 68 slices shown (3 of 4)]
[im 1/68]
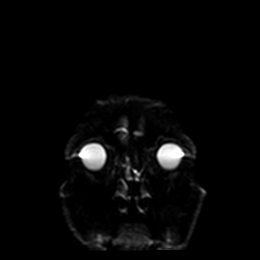
[im 23/68]
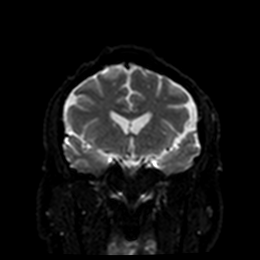
[im 45/68]
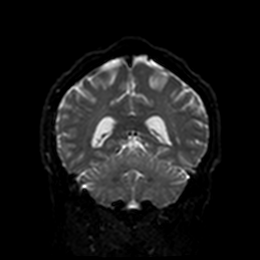
[im 68/68]
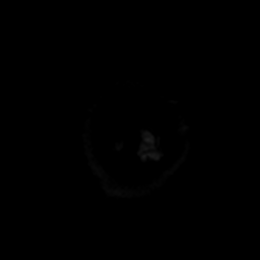

[Series 8: DWI · coronal · 4.0mm · 0.88mm/px · 2 of 34 slices shown (4 of 4)]
[im 1/34]
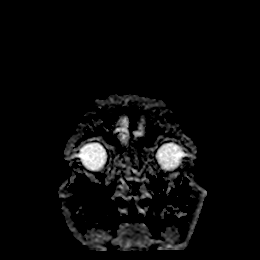
[im 34/34]
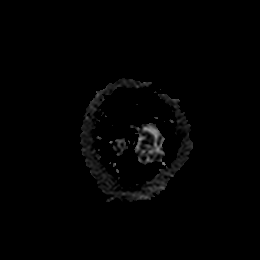

[Series 9: T1 · sagittal · 5.0mm · 0.75mm/px · 2 of 25 slices shown]
[im 1/25]
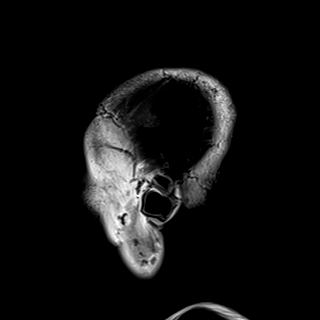
[im 25/25]
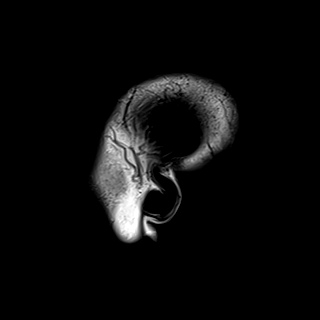

[Series 10: T2 · axial · 5.0mm · 0.72mm/px · z∈[-105,+38]mm · 2 of 25 slices shown]
[im 1/25]
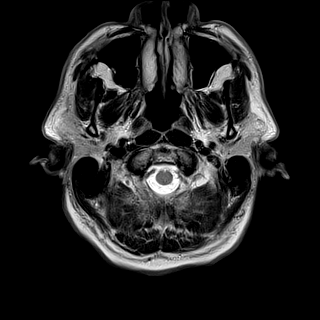
[im 25/25]
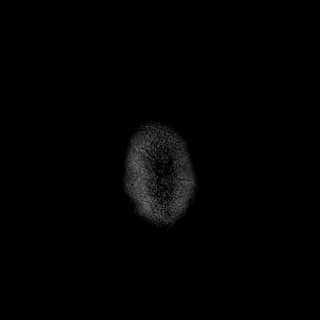

[Series 11: FLAIR · axial · 5.0mm · 0.45mm/px · z∈[-103,+40]mm · 2 of 25 slices shown]
[im 1/25]
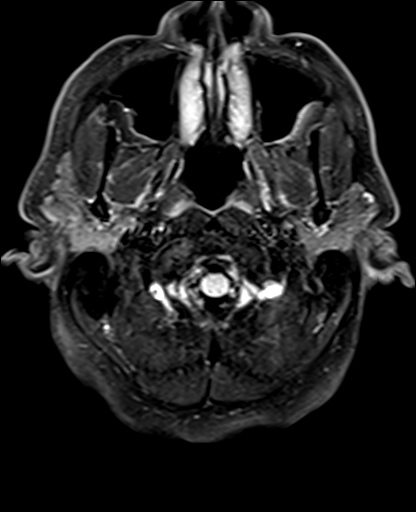
[im 25/25]
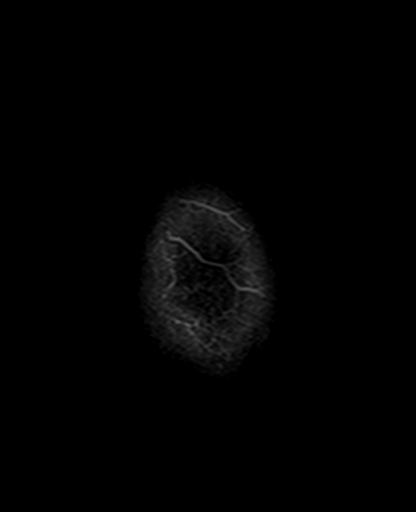

[Series 12: mag_images · axial · 3.0mm · 0.90mm/px · z∈[-120,+56]mm · 4 of 60 slices shown]
[im 1/60]
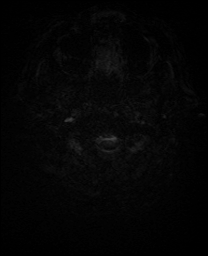
[im 20/60]
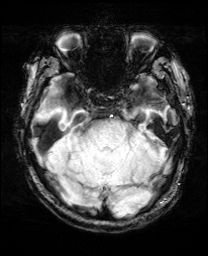
[im 40/60]
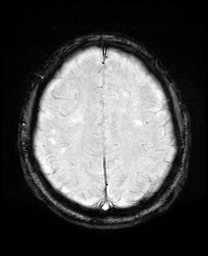
[im 60/60]
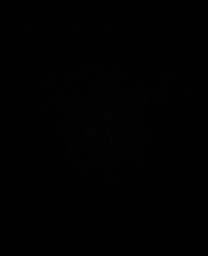

[Series 13: pha_images · axial · 3.0mm · 0.90mm/px · z∈[-120,+44]mm · 4 of 56 slices shown]
[im 1/56]
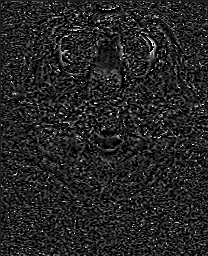
[im 19/56]
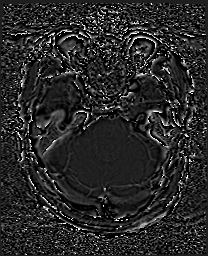
[im 37/56]
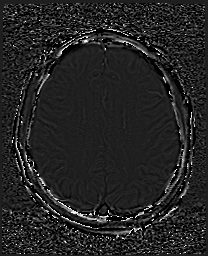
[im 56/56]
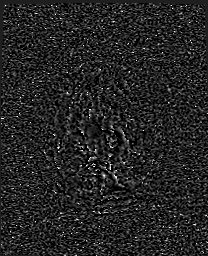

[Series 14: swi_images · axial · 3.0mm · 0.90mm/px · z∈[-120,+56]mm · 4 of 60 slices shown]
[im 1/60]
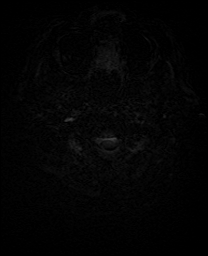
[im 20/60]
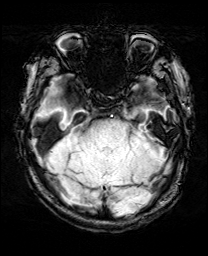
[im 40/60]
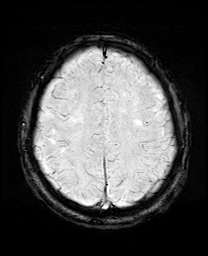
[im 60/60]
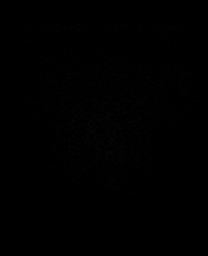

[Series 15: mip_images(sw) · axial · 24.0mm · 0.90mm/px · z∈[-109,+46]mm · 3 of 53 slices shown]
[im 1/53]
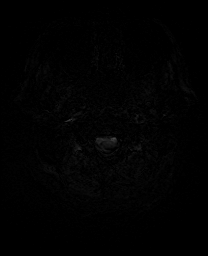
[im 27/53]
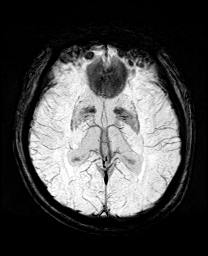
[im 53/53]
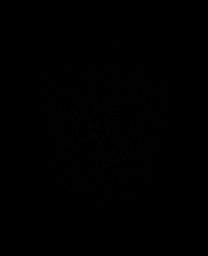

[Series 17: T2 post-contrast · coronal · 5.0mm · 0.72mm/px · 2 of 28 slices shown]
[im 1/28]
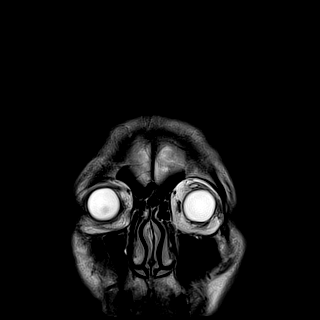
[im 28/28]
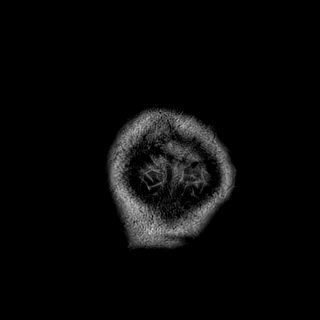

[Series 19: T1 post-contrast · coronal · 5.0mm · 0.34mm/px · 2 of 28 slices shown (1 of 2)]
[im 1/28]
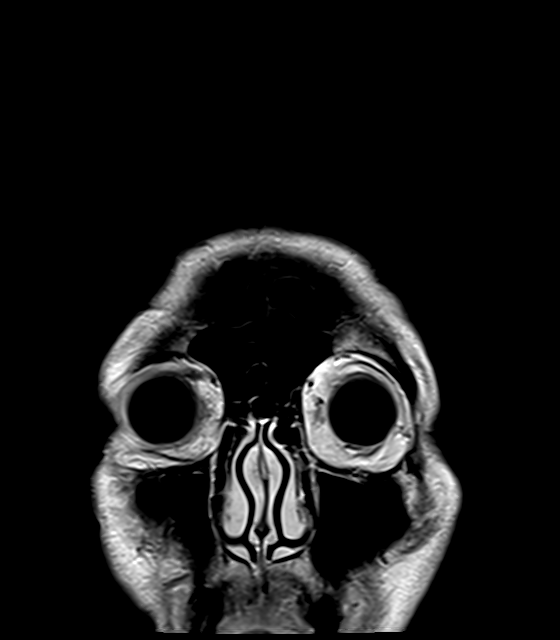
[im 28/28]
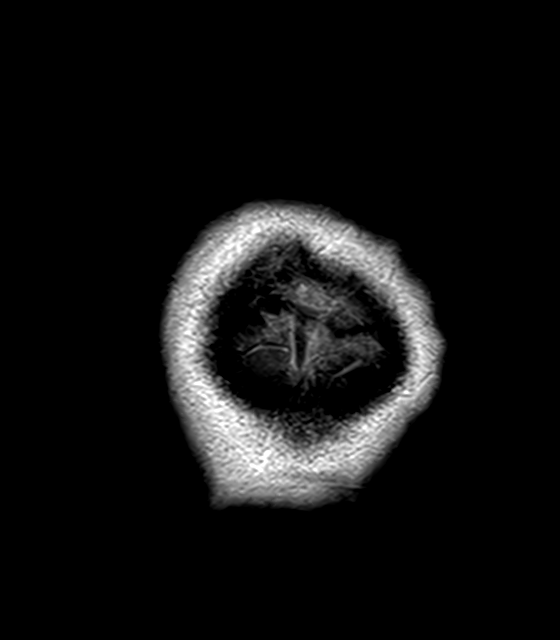

[Series 20: T1 post-contrast · sagittal · 5.0mm · 0.72mm/px · 2 of 25 slices shown (2 of 2)]
[im 1/25]
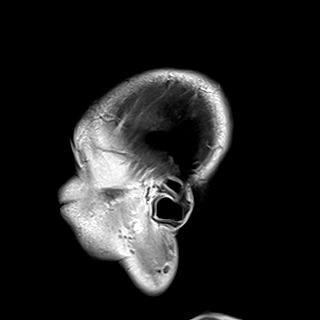
[im 25/25]
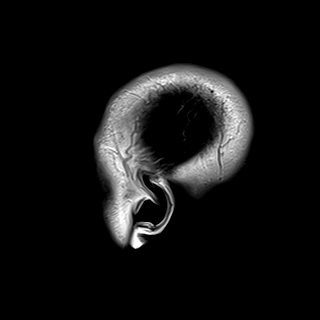

[40 of 48 positions shown; findings below may reference images not displayed]

FINDINGS: Brain: There is cortical/subcortical reduced diffusion along the
right precentral and postcentral gyri. There is no evidence of
intracranial hemorrhage. There is no intracranial mass or
significant mass effect. There is no hydrocephalus or extra-axial
fluid collection. Patchy foci of T2 hyperintensity in the
supratentorial white matter are nonspecific but probably reflect
mild chronic microvascular ischemic changes. Ventricles and sulci
are within normal limits in size and configuration. No abnormal
enhancement.

Vascular: Major vessel flow voids at the skull base are preserved.

Skull and upper cervical spine: Normal marrow signal is preserved.

Sinuses/Orbits: Minor mucosal thickening.  Orbits are unremarkable.

Other: Sella is unremarkable.  Mastoid air cells are clear.
IMPRESSION: Small acute right frontoparietal infarcts involving the primary
sensorimotor gyri.

## 2021-05-28 ENCOUNTER — Other Ambulatory Visit: Payer: Self-pay | Admitting: Family Medicine

## 2021-07-21 ENCOUNTER — Other Ambulatory Visit: Payer: Self-pay

## 2021-07-21 ENCOUNTER — Ambulatory Visit (HOSPITAL_COMMUNITY)
Admission: EM | Admit: 2021-07-21 | Discharge: 2021-07-21 | Disposition: A | Discharge status: Home / Self Care | Payer: Self-pay

## 2021-07-21 ENCOUNTER — Encounter (HOSPITAL_COMMUNITY): Payer: Self-pay | Admitting: Emergency Medicine

## 2021-07-21 DIAGNOSIS — I10 Essential (primary) hypertension: Secondary | ICD-10-CM

## 2021-07-21 DIAGNOSIS — H11422 Conjunctival edema, left eye: Secondary | ICD-10-CM

## 2021-07-21 DIAGNOSIS — Z8673 Personal history of transient ischemic attack (TIA), and cerebral infarction without residual deficits: Secondary | ICD-10-CM

## 2021-07-21 NOTE — Discharge Instructions (Signed)
Please go to Dr. Laruth Bouchard office now. They will work you in for a complete evaluation of your eye.

## 2021-07-21 NOTE — ED Provider Notes (Signed)
[facility]   MRN: [id number] DOB: Dec 17, 1950  Subjective:   Randall Coffey is a 70 y.o. male presenting for 4-day history of acute onset persistent and worsening redness and swelling of his left thigh.  Patient reports that symptoms started after he went out to take the trash.  There was not any particular inciting event but when he came back in he was told that his eye was really red and has since progressed to what it is today.  He does have a history of stroke which happened 1 to 2 years ago.  At the time, he had facial asymmetry, left arm weakness and tingling.  Today, he denies any of those symptoms.  No headache, vision change, weakness, numbness or tingling, photophobia, eye pain, eye drainage, chest pain, shortness of breath, heart racing.  Patient does have carotid artery stenosis as well.  He is very compliant with his medications.  No current facility-administered medications for this encounter.  Current Outpatient Medications:    atorvastatin (LIPITOR) 80 MG tablet, TAKE ONE TABLET BY MOUTH EVERY EVENING AT 6:00 IN THE EVENING, Disp: 90 tablet, Rfl: 0   clopidogrel (PLAVIX) 75 MG tablet, TAKE ONE TABLET BY MOUTH DAILY, Disp: 90 tablet, Rfl: 0   losartan (COZAAR) 25 MG tablet, TAKE ONE TABLET BY MOUTH AT BEDTIME, Disp: 90 tablet, Rfl: 3   Multiple Vitamin (MULTIVITAMIN WITH MINERALS) TABS tablet, Take 1 tablet by mouth daily., Disp: , Rfl:    PHENObarbital (LUMINAL) 100 MG tablet, Take 100 mg by mouth daily. , Disp: , Rfl: 3   No Known Allergies  Past Medical History:  Diagnosis Date   Seizures (HCC)    Stroke [facility])      History reviewed. No pertinent surgical history.  Family History  Problem Relation Age of Onset   Diabetes Sister     Social History   Tobacco Use   Smoking status: Never   Smokeless tobacco: Never  Vaping Use   Vaping Use: Never used  Substance Use Topics   Alcohol use: No   Drug use: No    ROS   Objective:    Vitals: BP (!) 177/87    Pulse (!) 55    Temp (!) 97.5 F (36.4 C) (Oral)    Resp 18    SpO2 95%   BP Readings from Last 3 Encounters:  07/21/21 (!) 177/87  12/09/20 125/73  09/18/20 135/74   Physical Exam Constitutional:      General: He is not in acute distress.    Appearance: Normal appearance. He is normal weight. He is not ill-appearing.  HENT:     Head: Normocephalic and atraumatic.     Right Ear: Tympanic membrane, ear canal and external ear normal. There is no impacted cerumen.     Left Ear: Tympanic membrane, ear canal and external ear normal. There is no impacted cerumen.     Nose: Nose normal. No congestion or rhinorrhea.     Mouth/Throat:     Mouth: Mucous membranes are moist.     Pharynx: Oropharynx is clear. No oropharyngeal exudate or posterior oropharyngeal erythema.  Eyes:     General: No scleral icterus.       Right eye: No discharge or hordeolum.        Left eye: No discharge or hordeolum.     Extraocular Movements: Extraocular movements intact.     Right eye: Normal extraocular motion and no nystagmus.     Left eye: Normal extraocular  motion and no nystagmus.     Conjunctiva/sclera:     Right eye: Right conjunctiva is not injected. No chemosis, exudate or hemorrhage.    Left eye: Chemosis present.     Pupils: Pupils are equal, round, and reactive to light.  Cardiovascular:     Rate and Rhythm: Normal rate.  Pulmonary:     Effort: Pulmonary effort is normal.  Musculoskeletal:        General: Normal range of motion.     Cervical back: Normal range of motion and neck supple. No rigidity. No muscular tenderness.     Comments: Strength 5/5 for upper extremities.  Neurological:     General: No focal deficit present.     Mental Status: He is alert and oriented to person, place, and time.     Cranial Nerves: No cranial nerve deficit.     Motor: No weakness.     Coordination: Coordination normal.     Gait: Gait normal.     Deep Tendon Reflexes: Reflexes  normal.     Comments: Negative Romberg and pronator drift.  No facial asymmetry.  Psychiatric:        Mood and Affect: Mood normal.        Behavior: Behavior normal.        Thought Content: Thought content normal.        Judgment: Judgment normal.       Assessment and Plan :   PDMP not reviewed this encounter.  1. Chemosis of conjunctiva, left   2. History of CVA in adulthood   3. Essential hypertension    Patient has significant chemosis of the left eye.  Discussed case with Dr. Dione Booze and he was agreeable to kindly see him in his clinic today.  Discussed this with patient and they will present to his clinic now.    Wallis Bamberg, [place] 07/21/21 1238

## 2021-07-21 NOTE — ED Triage Notes (Signed)
Pt is present today with left eye redness and irritation. Pt states that he noticed Friday it started turning red and patient has noticed slight blurriness today. Pt denies any pain

## 2021-08-27 ENCOUNTER — Other Ambulatory Visit: Payer: Self-pay | Admitting: Family Medicine

## 2021-11-26 ENCOUNTER — Other Ambulatory Visit: Payer: Self-pay | Admitting: Family Medicine

## 2021-12-29 ENCOUNTER — Other Ambulatory Visit: Payer: Self-pay

## 2021-12-30 ENCOUNTER — Other Ambulatory Visit: Payer: Self-pay

## 2021-12-30 MED ORDER — ATORVASTATIN CALCIUM 80 MG PO TABS: 80.0000 mg | ORAL_TABLET | Freq: Every day | ORAL | 0 refills | Status: DC

## 2021-12-31 MED ORDER — CLOPIDOGREL BISULFATE 75 MG PO TABS: 75.0000 mg | ORAL_TABLET | Freq: Every day | ORAL | 0 refills | Status: DC

## 2021-12-31 MED ORDER — LOSARTAN POTASSIUM 25 MG PO TABS: 25.0000 mg | ORAL_TABLET | Freq: Every day | ORAL | 0 refills | Status: DC

## 2022-01-02 ENCOUNTER — Other Ambulatory Visit: Payer: Self-pay

## 2022-01-02 DIAGNOSIS — I6523 Occlusion and stenosis of bilateral carotid arteries: Secondary | ICD-10-CM

## 2022-01-11 ENCOUNTER — Ambulatory Visit (HOSPITAL_COMMUNITY)
Admission: RE | Admit: 2022-01-11 | Discharge: 2022-01-11 | Disposition: A | Discharge status: Home / Self Care | Payer: Self-pay | Source: Ambulatory Visit | Attending: Surgery | Admitting: Surgery

## 2022-01-11 ENCOUNTER — Ambulatory Visit (INDEPENDENT_AMBULATORY_CARE_PROVIDER_SITE_OTHER): Payer: Self-pay | Admitting: Physician Assistant

## 2022-01-11 VITALS — BP 121/68 | HR 61 | Temp 98.0°F | Resp 20 | Ht 65.0 in | Wt 153.6 lb

## 2022-01-11 DIAGNOSIS — I6523 Occlusion and stenosis of bilateral carotid arteries: Secondary | ICD-10-CM

## 2022-01-11 NOTE — Progress Notes (Signed)
Office Note     CC:  follow up Requesting Provider:  Jackelyn Poling, DO  HPI: Randall Coffey is a 71 y.o. (April 04, 1951) male who presents for surveillance of carotid artery stenosis.  He is here today with his daughter-in-law who is acting as the interpreter.  Patient was initially seen in the hospital with left upper extremity weakness and ultimately found to have a small right brain stroke on MRI.  CT demonstrated less than 50% stenosis of the right ICA and duplex at the time was between 1 and 39% stenosis.  He has been managed medically.  He denies any strokelike symptoms since last office visit.  He also denies any tobacco use.  He is on Plavix and statin daily.   Past Medical History:  Diagnosis Date   Seizures (HCC)    Stroke [facility])     History reviewed. No pertinent surgical history.  Social History   Socioeconomic History   Marital status: Married    Spouse name: Not on file   Number of children: Not on file   Years of education: Not on file   Highest education level: Not on file  Occupational History   Not on file  Tobacco Use   Smoking status: Never    Passive exposure: Never   Smokeless tobacco: Never  Vaping Use   Vaping Use: Never used  Substance and Sexual Activity   Alcohol use: No   Drug use: No   Sexual activity: Never  Other Topics Concern   Not on file  Social History Narrative   Not on file   Social Determinants of Health   Financial Resource Strain: Not on file  Food Insecurity: Not on file  Transportation Needs: Not on file  Physical Activity: Not on file  Stress: Not on file  Social Connections: Not on file  Intimate Partner Violence: Not on file    Family History  Problem Relation Age of Onset   Diabetes Sister     Current Outpatient Medications  Medication Sig Dispense Refill   atorvastatin (LIPITOR) 80 MG tablet Take 1 tablet (80 mg total) by mouth daily. 90 tablet 0   clopidogrel (PLAVIX) 75 MG tablet Take 1 tablet (75 mg  total) by mouth daily. 30 tablet 0   losartan (COZAAR) 25 MG tablet Take 1 tablet (25 mg total) by mouth at bedtime. 30 tablet 0   Multiple Vitamin (MULTIVITAMIN WITH MINERALS) TABS tablet Take 1 tablet by mouth daily.     PHENObarbital (LUMINAL) 100 MG tablet Take 100 mg by mouth daily.   3   No current facility-administered medications for this visit.    No Known Allergies   REVIEW OF SYSTEMS:   [X]  denotes positive finding, [ ]  denotes negative finding Cardiac  Comments:  Chest pain or chest pressure:    Shortness of breath upon exertion:    Short of breath when lying flat:    Irregular heart rhythm:        Vascular    Pain in calf, thigh, or hip brought on by ambulation:    Pain in feet at night that wakes you up from your sleep:     Blood clot in your veins:    Leg swelling:         Pulmonary    Oxygen at home:    Productive cough:     Wheezing:         Neurologic    Sudden weakness in arms or legs:  Sudden numbness in arms or legs:     Sudden onset of difficulty speaking or slurred speech:    Temporary loss of vision in one eye:     Problems with dizziness:         Gastrointestinal    Blood in stool:     Vomited blood:         Genitourinary    Burning when urinating:     Blood in urine:        Psychiatric    Major depression:         Hematologic    Bleeding problems:    Problems with blood clotting too easily:        Skin    Rashes or ulcers:        Constitutional    Fever or chills:      PHYSICAL EXAMINATION:  Vitals:   01/11/22 1450 01/11/22 1452  BP: 131/75 121/68  Pulse: 61   Resp: 20   Temp: 98 F (36.7 C)   TempSrc: Temporal   SpO2: 95%   Weight: 153 lb 9.6 oz (69.7 kg)   Height: 5\' 5"  (1.651 m)     General:  WDWN in NAD; vital signs documented above Gait: Not observed HENT: WNL, normocephalic Pulmonary: normal non-labored breathing , without Rales, rhonchi,  wheezing Cardiac: regular HR Abdomen: soft, NT, no masses Skin:  without rashes Vascular Exam/Pulses:  Right Left  Radial 2+ (normal) 2+ (normal)   Extremities: without ischemic changes, without Gangrene , without cellulitis; without open wounds;  Musculoskeletal: no muscle wasting or atrophy  Neurologic: A&O X 3;  CN grossly intact Psychiatric:  The pt has Normal affect.   Non-Invasive Vascular Imaging:   R ICA 40-59% stenosis L ICA 1-39% stenosis    ASSESSMENT/PLAN:: 71 y.o. male here for follow up for surveillance of carotid artery stenosis  -Subjectively patient has not had any neurological symptoms since last office visit -Carotid duplex is stable demonstrating 40 to 59% of the right ICA and minimal disease noted in the left ICA -No indication for asymptomatic right ICA stenosis at this time -Recheck carotid duplex in 1 year -Continue to follow regularly with PCP for management of chronic medical conditions   67, PA-C [facility] [phone]  Clinic MD:   [phone]

## 2022-02-01 ENCOUNTER — Ambulatory Visit (INDEPENDENT_AMBULATORY_CARE_PROVIDER_SITE_OTHER): Payer: Self-pay | Admitting: Family Medicine

## 2022-02-01 ENCOUNTER — Encounter: Payer: Self-pay | Admitting: Family Medicine

## 2022-02-01 VITALS — BP 118/72 | HR 68 | Ht 65.0 in | Wt 148.0 lb

## 2022-02-01 DIAGNOSIS — E119 Type 2 diabetes mellitus without complications: Secondary | ICD-10-CM

## 2022-02-01 DIAGNOSIS — E785 Hyperlipidemia, unspecified: Secondary | ICD-10-CM

## 2022-02-01 DIAGNOSIS — I10 Essential (primary) hypertension: Secondary | ICD-10-CM

## 2022-02-01 DIAGNOSIS — E1169 Type 2 diabetes mellitus with other specified complication: Secondary | ICD-10-CM

## 2022-02-01 DIAGNOSIS — I6523 Occlusion and stenosis of bilateral carotid arteries: Secondary | ICD-10-CM

## 2022-02-01 DIAGNOSIS — M25561 Pain in right knee: Secondary | ICD-10-CM

## 2022-02-01 DIAGNOSIS — B351 Tinea unguium: Secondary | ICD-10-CM | POA: Insufficient documentation

## 2022-02-01 LAB — POCT GLYCOSYLATED HEMOGLOBIN (HGB A1C): Hemoglobin A1C: 5.8 % — AB (ref 4.0–5.6)

## 2022-02-01 MED ORDER — LOSARTAN POTASSIUM 25 MG PO TABS: 25.0000 mg | ORAL_TABLET | Freq: Every day | ORAL | 0 refills | Status: DC

## 2022-02-01 MED ORDER — ATORVASTATIN CALCIUM 80 MG PO TABS
80.0000 mg | ORAL_TABLET | Freq: Every day | ORAL | 0 refills | Status: DC
Start: 2022-02-01 — End: 2022-04-04

## 2022-02-01 MED ORDER — ADULT MULTIVITAMIN W/MINERALS CH: 1.0000 | ORAL_TABLET | Freq: Every day | ORAL | 3 refills | Status: AC

## 2022-02-01 MED ORDER — CLOPIDOGREL BISULFATE 75 MG PO TABS: 75.0000 mg | ORAL_TABLET | Freq: Every day | ORAL | 0 refills | Status: DC

## 2022-02-01 NOTE — Assessment & Plan Note (Signed)
Well controlled (118/72 today).  - Cont losartan

## 2022-02-01 NOTE — Assessment & Plan Note (Signed)
Well controlled. A1c 5.8 today. Managed with lifestyle changes. Foot exam today unremarkable. Discussed ok to have some carbs given how well controlled A1c is.

## 2022-02-01 NOTE — Assessment & Plan Note (Addendum)
3 month hx of R medial knee pain. 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 Exam showed full ROM and no tenderness to palpation.  - Tylenol and ibuprofen prn

## 2022-02-01 NOTE — Progress Notes (Signed)
    SUBJECTIVE:   CHIEF COMPLAINT / HPI: Establish care and med refill  ER is a 71yo M who presents to establish care since his PCP retired. Daughter provided Spanish interpreting for the visit, with patient consent. Pt also reports 3 month hx of R medial knee pain. He has not taken medication for the pain.  PERTINENT  PMH / PSH: Stroke 12/2019 BL Carotid artery stenosis T2DM HTN  OBJECTIVE:   BP 118/72   Pulse 68   Ht 5\' 5"  (1.651 m)   Wt 148 lb (67.1 kg)   SpO2 99%   BMI 24.63 kg/m   Gen: Well-appearing, cheerful elderly male Pulm: Normal WOB MSK: R knee with full ROM, no medial tenderness to palpation. Negative varus and valgus.  Foot exam: Sensation grossly intact. No ulcers. Thickened yellow toe nails bilaterally.  ASSESSMENT/PLAN:   Right knee pain 3 month hx of R medial knee pain. Pt works a lot on for work [employer]). Exam showed full ROM and no tenderness to palpation.  - Tylenol and ibuprofen prn  Diabetes mellitus, type 2 (HCC) Well controlled. A1c 5.8 today. Managed with lifestyle changes. Foot exam today unremarkable. Discussed ok to have some carbs given how well controlled A1c is.   Hyperlipidemia associated with type 2 diabetes mellitus (HCC) Lipid wnl 04/2020. Will recheck today and continue statin.  - f/u lipid panel - Cont atorvastatin  Hypertension Well controlled (118/72 today).  - Cont losartan  Carotid stenosis - Cont clopidogrel  - Statin and f/u lipid panel as above   05/2020, MD [facility]

## 2022-02-01 NOTE — Assessment & Plan Note (Addendum)
-   Cont clopidogrel  - Statin and f/u lipid panel as above

## 2022-02-01 NOTE — Assessment & Plan Note (Addendum)
Lipid wnl 04/2020. Will recheck today and continue statin.  - f/u lipid panel - Cont atorvastatin

## 2022-02-01 NOTE — Patient Instructions (Signed)
Good to see you today - Thank you for coming in!  Things we discussed today:  1) Diabetes: Your A1c is 5.8 today, which means that your diabetes is well controlled! The healthy goal is to have an A1c <7. Continue to enjoy carbs (rice, tortillas, fruits) in moderation and maintain a healthy diet.  2) Cholesterol: We will check your cholesterol today. If there are any abnormal results, I will reach out via phone call.   3) Knee Pain: For your knee pain, I recommend using Ibuprofen or Tylenol as needed.   Please always bring your medication bottles  Come back to see me in 6 months for followup.

## 2022-02-02 LAB — LIPID PANEL
Chol/HDL Ratio: 2.7 ratio (ref 0.0–5.0)
Cholesterol, Total: 123 mg/dL (ref 100–199)
HDL: 45 mg/dL (ref >39)
LDL Chol Calc (NIH): 52 mg/dL (ref 0–99)
Triglycerides: 151 mg/dL — ABNORMAL HIGH (ref 0–149)
VLDL Cholesterol Cal: 26 mg/dL (ref 5–40)

## 2022-02-03 ENCOUNTER — Encounter: Payer: Self-pay | Admitting: Family Medicine

## 2022-03-01 ENCOUNTER — Other Ambulatory Visit: Payer: Self-pay | Admitting: Family Medicine

## 2022-03-29 ENCOUNTER — Other Ambulatory Visit: Payer: Self-pay | Admitting: Family Medicine

## 2022-04-06 MED ORDER — LOSARTAN POTASSIUM 25 MG PO TABS
25.0000 mg | ORAL_TABLET | Freq: Every day | ORAL | 0 refills | Status: DC
Start: 2022-04-06 — End: 2022-08-06

## 2022-04-06 MED ORDER — CLOPIDOGREL BISULFATE 75 MG PO TABS: 75.0000 mg | ORAL_TABLET | Freq: Every day | ORAL | 0 refills | Status: AC

## 2022-04-06 MED ORDER — ATORVASTATIN CALCIUM 80 MG PO TABS: 80.0000 mg | ORAL_TABLET | Freq: Every day | ORAL | 0 refills | Status: DC

## 2022-04-06 NOTE — Addendum Note (Signed)
Addended by: Lincoln Brigham on: 04/06/2022 02:01 PM   Modules accepted: Orders

## 2022-04-06 NOTE — Telephone Encounter (Signed)
Patient's daughter in law, [name], calls nurse line regarding prescription refills.   She is requesting that quantity be updated so patient can received 90 days for plavix and losartan.   Please send updated prescriptions to [facility] if appropriate.   Veronda Prude, RN

## 2022-06-21 ENCOUNTER — Encounter: Payer: Self-pay | Admitting: Student

## 2022-06-21 ENCOUNTER — Ambulatory Visit (INDEPENDENT_AMBULATORY_CARE_PROVIDER_SITE_OTHER): Payer: Self-pay | Admitting: Student

## 2022-06-21 VITALS — BP 118/72 | HR 80 | Wt 140.6 lb

## 2022-06-21 DIAGNOSIS — R972 Elevated prostate specific antigen [PSA]: Secondary | ICD-10-CM

## 2022-06-21 DIAGNOSIS — R351 Nocturia: Secondary | ICD-10-CM

## 2022-06-21 LAB — POCT URINALYSIS DIP (MANUAL ENTRY)
Bilirubin, UA: NEGATIVE
Blood, UA: NEGATIVE
Glucose, UA: NEGATIVE mg/dL
Ketones, POC UA: NEGATIVE mg/dL
Leukocytes, UA: NEGATIVE
Nitrite, UA: NEGATIVE
Protein Ur, POC: NEGATIVE mg/dL
Spec Grav, UA: 1.02 (ref 1.010–1.025)
Urobilinogen, UA: 1 E.U./dL
pH, UA: 7 (ref 5.0–8.0)

## 2022-06-21 MED ORDER — TAMSULOSIN HCL 0.4 MG PO CAPS: 0.4000 mg | ORAL_CAPSULE | Freq: Every day | ORAL | 3 refills | Status: DC

## 2022-06-21 NOTE — Assessment & Plan Note (Signed)
Pt reports increased nocturia over the past year. No fever, dysuria, unintentional weight loss, night sweats. Urinalysis done today was negative; DRE without nodular, hard prostate. Given pt's age, presentation and exam findings, symptoms most likely due to BPH. Less concern for malignancy given largely benign physical exam findings and lack of systemic symptoms. - Will arrange for PSA, BMP to evaluate prostate and kidneys - Will start pt on Flomax 0.4 mg daily to improve symptoms

## 2022-06-21 NOTE — Progress Notes (Signed)
    SUBJECTIVE:   CHIEF COMPLAINT / HPI:   Randall Coffey is a 71 y.o. male who presents today for frequent urination. He speaks primarily Spanish; his daughter Randall Coffey) was present and acted as Nurse, learning disability with pt's consent.  [name] reports increased frequency of urination ongoing for the past year. He reports getting up to pee 3-4 times a night. No burning, pain with urination. No fever, chills, body ache, night sweats. No unexplained weight loss. Symptoms have been relatively stable ober the past year.  He also reports an occasional "needle pain" around umbilical region. This began around 6 months ago; pain lasts 30 min and then resolves on its own. Not related to anything he can tell. Sometimes drinking water helps.  PERTINENT  PMH / PSH: HTN, HLD, DM2, Carotid artery stenosis, CVA (12/2019)  OBJECTIVE:   BP 118/72   Pulse 80   Wt 140 lb 9.6 oz (63.8 kg)   BMI 23.40 kg/m   General: Pt is well-appearing and seated comfortably in chair. NAD. Cardiovascular: RRR, no murmurs, rubs, gallops. Pulmonary: Normal work of breathing. Lungs clear to auscultation bilaterally. Abdomen: Normal bowel sounds. Soft and nondistended in all four quadrants. No tenderness to palpation; no rebound tenderness, no guarding. Possible small umbilical hernia. Genitourinary: Digital rectal exam performed in clinic today with chaperone present. Prostate was not hard, tender, nor nodular. Neuro/Psych: A&Ox3. Normal affect.  Urinalysis (06/21/2022): Negative  ASSESSMENT/PLAN:   Nocturia Pt reports increased nocturia over the past year. No fever, dysuria, unintentional weight loss, night sweats. Urinalysis done today was negative; DRE without nodular, hard prostate. Given pt's age, presentation and exam findings, symptoms most likely due to BPH. Less concern for malignancy given largely benign physical exam findings and lack of systemic symptoms. - Will arrange for PSA, BMP to evaluate prostate and  kidneys - Will start pt on Flomax 0.4 mg daily to improve symptoms   Abdominal Pain Given intermittent nature of pain with lack of other systemic symptoms, not concerned at this time.  - No further workup indicated at this time.  Healthcare Maintenance Pt needing multiple healthcare maintenance items addressed. - Encouraged pt to schedule follow-up with PCP to address healthcare maintenance items. - Given pt's lack of insurance, will not give flu shot in clinic today; encouraged pt to get shot at local pharmacy   Governor Rooks, Medical Student [facility]     I have evaluated this patient along with Student Dr. Rema Jasmine and reviewed the above note, making necessary revisions.  Dorothyann Gibbs, MD 06/21/2022, 4:38 PM PGY-2, [facility]

## 2022-06-21 NOTE — Patient Instructions (Addendum)
[name],  It is great to meet you! We are checking some labs to make sure that your kindeys and prostate look okay. Your urine did not show any signs of infection.  Please make an appointment with your PCP for a check-up to catch up on several health-maintenance items.   I'm sending you home with some medicine to help with the urinary frequency. Take this once per day.   Dorothyann Gibbs, MD

## 2022-06-22 LAB — PSA: Prostate Specific Ag, Serum: 4.5 ng/mL — ABNORMAL HIGH (ref 0.0–4.0)

## 2022-06-22 LAB — BASIC METABOLIC PANEL
BUN/Creatinine Ratio: 22 (ref 10–24)
BUN: 16 mg/dL (ref 8–27)
CO2: 24 mmol/L (ref 20–29)
Calcium: 8.7 mg/dL (ref 8.6–10.2)
Chloride: 104 mmol/L (ref 96–106)
Creatinine, Ser: 0.74 mg/dL — ABNORMAL LOW (ref 0.76–1.27)
Glucose: 95 mg/dL (ref 70–99)
Potassium: 4.2 mmol/L (ref 3.5–5.2)
Sodium: 142 mmol/L (ref 134–144)
eGFR: 97 mL/min/{1.73_m2} (ref >59)

## 2022-06-23 NOTE — Addendum Note (Signed)
Addended by: Darnelle Spangle B on: 06/23/2022 09:20 AM   Modules accepted: Orders

## 2022-06-30 ENCOUNTER — Telehealth: Payer: Self-pay

## 2022-06-30 NOTE — Telephone Encounter (Signed)
Patient's daughter calls nurse line regarding results and referral to urologist.   Daughter was unsure why referral was placed. Advised of PSA level per result note. Daughter has further questions and would like to speak with provider.   Please return call to daughter at [phone].  Veronda Prude, RN

## 2022-08-03 ENCOUNTER — Other Ambulatory Visit: Payer: Self-pay | Admitting: Family Medicine

## 2022-08-03 DIAGNOSIS — I6523 Occlusion and stenosis of bilateral carotid arteries: Secondary | ICD-10-CM

## 2022-08-03 DIAGNOSIS — I10 Essential (primary) hypertension: Secondary | ICD-10-CM

## 2022-10-05 ENCOUNTER — Telehealth: Payer: Self-pay

## 2022-10-05 NOTE — Telephone Encounter (Signed)
Received VM from [place] at [facility] regarding patient. Doctor is wanting to perform a biopsy, however, patient is currently taking Plavix. They are asking if provider is okay with patient holding Plavix for five days prior to procedure.   Will forward to PCP for further advisement.   Please return call to Kim at [phone] with clearance or instructions for how patient should proceed.   Talbot Grumbling, RN

## 2022-12-06 ENCOUNTER — Other Ambulatory Visit: Payer: Self-pay | Admitting: Student

## 2022-12-06 DIAGNOSIS — R351 Nocturia: Secondary | ICD-10-CM

## 2022-12-28 ENCOUNTER — Other Ambulatory Visit: Payer: Self-pay | Admitting: Family Medicine

## 2022-12-28 DIAGNOSIS — E1169 Type 2 diabetes mellitus with other specified complication: Secondary | ICD-10-CM

## 2023-05-08 ENCOUNTER — Other Ambulatory Visit: Payer: Self-pay | Admitting: Family Medicine

## 2023-05-08 DIAGNOSIS — I10 Essential (primary) hypertension: Secondary | ICD-10-CM

## 2023-05-08 DIAGNOSIS — I6523 Occlusion and stenosis of bilateral carotid arteries: Secondary | ICD-10-CM

## 2023-05-19 ENCOUNTER — Other Ambulatory Visit: Payer: Self-pay | Admitting: *Deleted

## 2023-05-19 DIAGNOSIS — I6523 Occlusion and stenosis of bilateral carotid arteries: Secondary | ICD-10-CM

## 2023-05-31 ENCOUNTER — Ambulatory Visit (HOSPITAL_COMMUNITY)
Admission: RE | Admit: 2023-05-31 | Discharge: 2023-05-31 | Disposition: A | Discharge status: Home / Self Care | Payer: Self-pay | Source: Ambulatory Visit | Attending: Vascular Surgery | Admitting: Vascular Surgery

## 2023-05-31 ENCOUNTER — Ambulatory Visit (INDEPENDENT_AMBULATORY_CARE_PROVIDER_SITE_OTHER): Payer: Self-pay | Admitting: Physician Assistant

## 2023-05-31 VITALS — BP 130/66 | HR 51 | Temp 98.2°F | Resp 18 | Ht 65.0 in | Wt 140.0 lb

## 2023-05-31 DIAGNOSIS — I6523 Occlusion and stenosis of bilateral carotid arteries: Secondary | ICD-10-CM | POA: Insufficient documentation

## 2023-05-31 NOTE — Progress Notes (Signed)
HISTORY AND PHYSICAL     CC:  follow up. Requesting Provider:  Lincoln Brigham, MD  HPI: This is a 72 y.o. male here for follow up for carotid artery stenosis.  Pt was initially seen in the hospital with left upper extremity weakness and ultimately found to have a small right brain stroke on MRI. CT demonstrated less than 50% stenosis of the right ICA and duplex at the time was between 1 and 39% stenosis. He has been managed medically.   Pt was last seen 01/11/2022 and at that time he was doing well without any stroke sx.  He was on plavix and statin daily and was not smoking.   Pt returns today for follow up and here with his daughter in law who is translating for him.    Pt denies any amaurosis fugax, speech difficulties, weakness, numbness, paralysis or clumsiness or facial droop.  He does have some tingling in the left shoulder that he states feels like ants under his skin.     The pt is on a statin for cholesterol management.  The pt is not on a daily aspirin.   Other AC:  Plavix The pt is on ARB for hypertension.   The pt is not on medication for diabetes Tobacco hx:  never   Past Medical History:  Diagnosis Date   Seizures (HCC)    Stroke (HCC)     No past surgical history on file.  No Known Allergies  Current Outpatient Medications  Medication Sig Dispense Refill   atorvastatin (LIPITOR) 80 MG tablet TAKE 1 TABLET BY MOUTH DAILY 90 tablet 2   clopidogrel (PLAVIX) 75 MG tablet Take 1 tablet (75 mg total) by mouth daily. Please make appointment with PCP before next refill. 90 tablet 0   losartan (COZAAR) 25 MG tablet Take 1 tablet (25 mg total) by mouth at bedtime. Please make appointment with PCP before next refill. 90 tablet 0   Multiple Vitamin (MULTIVITAMIN WITH MINERALS) TABS tablet Take 1 tablet by mouth daily. 90 tablet 3   PHENObarbital (LUMINAL) 100 MG tablet Take 100 mg by mouth daily.   3   tamsulosin (FLOMAX) 0.4 MG CAPS capsule TAKE 1 CAPSULE BY MOUTH DAILY 90  capsule 3   No current facility-administered medications for this visit.    Family History  Problem Relation Age of Onset   Diabetes Sister     Social History   Socioeconomic History   Marital status: Married    Spouse name: Not on file   Number of children: Not on file   Years of education: Not on file   Highest education level: Not on file  Occupational History   Not on file  Tobacco Use   Smoking status: Never    Passive exposure: Never   Smokeless tobacco: Never  Vaping Use   Vaping status: Never Used  Substance and Sexual Activity   Alcohol use: No   Drug use: No   Sexual activity: Never  Other Topics Concern   Not on file  Social History Narrative   Not on file   Social Determinants of Health   Financial Resource Strain: Not on file  Food Insecurity: Not on file  Transportation Needs: Not on file  Physical Activity: Not on file  Stress: Not on file  Social Connections: Not on file  Intimate Partner Violence: Not on file     REVIEW OF SYSTEMS:   [X]  denotes positive finding, [ ]  denotes negative finding Cardiac  Comments:  Chest pain or chest pressure:    Shortness of breath upon exertion:    Short of breath when lying flat:    Irregular heart rhythm:        Vascular    Pain in calf, thigh, or hip brought on by ambulation:    Pain in feet at night that wakes you up from your sleep:     Blood clot in your veins:    Leg swelling:         Pulmonary    Oxygen at home:    Productive cough:     Wheezing:         Neurologic    Sudden weakness in arms or legs:      numbness in shoulder x See HPI  Sudden onset of difficulty speaking or slurred speech:    Temporary loss of vision in one eye:     Problems with dizziness:         Gastrointestinal    Blood in stool:     Vomited blood:         Genitourinary    Burning when urinating:     Blood in urine:        Psychiatric    Major depression:         Hematologic    Bleeding problems:     Problems with blood clotting too easily:        Skin    Rashes or ulcers:        Constitutional    Fever or chills:      PHYSICAL EXAMINATION:  Today's Vitals   05/31/23 1359 05/31/23 1404  BP: (!) 146/73 130/66  Pulse: (!) 51   Resp: 18   Temp: 98.2 F (36.8 C)   TempSrc: Temporal   SpO2: 94%   Weight: 140 lb (63.5 kg)   Height: 5\' 5"  (1.651 m)   PainSc: 0-No pain    Body mass index is 23.3 kg/m.   General:  WDWN in NAD; vital signs documented above Gait: Not observed HENT: WNL, normocephalic Pulmonary: normal non-labored breathing Cardiac: regular HR, without carotid bruits Skin: without rashes Vascular Exam/Pulses:  Right Left  Radial 2+ (normal) 2+ (normal)   Extremities: without open wounds Musculoskeletal: no muscle wasting or atrophy  Neurologic: A&O X 3; moving all extremities equally; speech is fluent/normal Psychiatric:  The pt has Normal affect.   Non-Invasive Vascular Imaging:   Carotid Duplex on 05/19/2023 Right:  40-59% ICA stenosis Left:  1-39% ICA stenosis Vertebrals:  Bilateral vertebral arteries demonstrate antegrade flow.  Subclavians: Normal flow hemodynamics were seen in bilateral subclavian arteries.   Previous Carotid duplex on 01/11/2022: Right: 40-59% ICA stenosis Left:   1-39% ICA stenosis    ASSESSMENT/PLAN:: 72 y.o. male here for follow up carotid artery stenosis and pt was initially seen in the hospital with left upper extremity weakness and ultimately found to have a small right brain stroke on MRI. CT demonstrated less than 50% stenosis of the right ICA and duplex at the time was between 1 and 39% stenosis. He has been managed medically.   -duplex today reveals it is stable with right ICA stenosis of 40-59, which is unchanged from previous visit and 1-39% left ICA stenosis.  He does have some tingling in the left shoulder, but do not feel this is due to his carotid stenosis. He also had normal flow hemodynamics in his  bilateral subclavian arteries.   -discussed s/s of stroke with pt and he  understands should he develop any of these sx, he will go to the nearest ER or call 911. -pt will f/u in one year with carotid duplex -pt will call sooner should he have any issues. -continue statin/Plavix   Doreatha Massed, [facility] [phone]  Clinic MD:  Chestine Spore

## 2023-06-16 ENCOUNTER — Other Ambulatory Visit: Payer: Self-pay

## 2023-06-16 DIAGNOSIS — I6523 Occlusion and stenosis of bilateral carotid arteries: Secondary | ICD-10-CM

## 2023-08-09 ENCOUNTER — Other Ambulatory Visit: Payer: Self-pay | Admitting: Family Medicine

## 2023-08-09 DIAGNOSIS — I6523 Occlusion and stenosis of bilateral carotid arteries: Secondary | ICD-10-CM

## 2023-08-09 DIAGNOSIS — I10 Essential (primary) hypertension: Secondary | ICD-10-CM

## 2023-08-11 ENCOUNTER — Encounter: Payer: Self-pay | Admitting: Family Medicine

## 2023-08-11 ENCOUNTER — Ambulatory Visit (INDEPENDENT_AMBULATORY_CARE_PROVIDER_SITE_OTHER): Payer: Self-pay | Admitting: Family Medicine

## 2023-08-11 VITALS — BP 141/70 | HR 51 | Ht 65.0 in | Wt 141.6 lb

## 2023-08-11 DIAGNOSIS — I6523 Occlusion and stenosis of bilateral carotid arteries: Secondary | ICD-10-CM

## 2023-08-11 DIAGNOSIS — L299 Pruritus, unspecified: Secondary | ICD-10-CM

## 2023-08-11 DIAGNOSIS — E1169 Type 2 diabetes mellitus with other specified complication: Secondary | ICD-10-CM

## 2023-08-11 DIAGNOSIS — E785 Hyperlipidemia, unspecified: Secondary | ICD-10-CM

## 2023-08-11 DIAGNOSIS — Z7984 Long term (current) use of oral hypoglycemic drugs: Secondary | ICD-10-CM

## 2023-08-11 DIAGNOSIS — E119 Type 2 diabetes mellitus without complications: Secondary | ICD-10-CM

## 2023-08-11 DIAGNOSIS — Z1211 Encounter for screening for malignant neoplasm of colon: Secondary | ICD-10-CM

## 2023-08-11 DIAGNOSIS — Z1212 Encounter for screening for malignant neoplasm of rectum: Secondary | ICD-10-CM

## 2023-08-11 DIAGNOSIS — I10 Essential (primary) hypertension: Secondary | ICD-10-CM

## 2023-08-11 DIAGNOSIS — R972 Elevated prostate specific antigen [PSA]: Secondary | ICD-10-CM

## 2023-08-11 LAB — POCT GLYCOSYLATED HEMOGLOBIN (HGB A1C): HbA1c, POC (controlled diabetic range): 6 % (ref 0.0–7.0)

## 2023-08-11 MED ORDER — ATORVASTATIN CALCIUM 80 MG PO TABS: 80.0000 mg | ORAL_TABLET | Freq: Every day | ORAL | 3 refills | Status: AC

## 2023-08-11 MED ORDER — CLOPIDOGREL BISULFATE 75 MG PO TABS: 75.0000 mg | ORAL_TABLET | Freq: Every day | ORAL | 3 refills | Status: AC

## 2023-08-11 MED ORDER — LOSARTAN POTASSIUM 25 MG PO TABS: 25.0000 mg | ORAL_TABLET | Freq: Every day | ORAL | 3 refills | Status: AC

## 2023-08-11 NOTE — Progress Notes (Signed)
    SUBJECTIVE:   CHIEF COMPLAINT / HPI:   ER is a 73yo M w/ hx of elevated PSA, HTN, HLD, T2DM, CVA that p/f med refill.  L Shoulder Tingling - Reports getting occasional tingling sensation in L shoulder for past year. - It occurs when he's laying down. - It feels like ants in his arm.  - Reports getting cramps in the arm.  - Denies weakness in UE. Denies chest pain or SOB during these episodes.   Pruritis - reports feeling generalized itching in his extremities for past 2 months.   A1c, UACR  Prostate Bx - Prostate biopsy was done at [facility]. Was supposed to f/u for repeat PSA.   T2DM - due for A1c and UACR - Lipid, bmp  OBJECTIVE:   BP (!) 141/70   Pulse (!) 51   Ht 5' 5 (1.651 m)   Wt 141 lb 9.6 oz (64.2 kg)   SpO2 98%   BMI 23.56 kg/m   General: Alert, pleasant man. NAD. HEENT: NCAT. MMM. CV: RRR, no murmurs. 2+ radial pulse in L UE. Cap refill <2.  Resp: CTAB, no wheezing or crackles. Normal WOB on RA.  Abm: Soft, nontender, nondistended. BS present. Ext: Moves all ext spontaneously. Normal active ROM of L shoulder. No tenderness to palpation of shoulder where pt reports tingling sensation (superior aspect of L shoulder).  Skin: Dry, flaky skin on BL shins and lower back.  ASSESSMENT/PLAN:   Assessment & Plan Type 2 diabetes mellitus without complication, without long-term current use of insulin (HCC) Diet controlled. A1c at goal. F/u UACR. Hyperlipidemia associated with type 2 diabetes mellitus (HCC) Lipid panel and BMP today - refill for lipitor  Hypertension, unspecified type At goal. BMP today. - Refill for losartan  Elevated PSA Repeat PSA elevated, was supposed to f/u with Urology. Provided contact info for [facility] and advised to f/u.  Encounter for colorectal cancer screening Due for colonoscopy. GI referral sent. Pruritus Exam notable for dry skin in areas of pruritus. Advised moisturizing daily with aquaphor/vaseline.      Twyla Nearing, MD [facility]

## 2023-08-11 NOTE — Patient Instructions (Signed)
 Good to see you today - Thank you for coming in  Things we discussed today:  1) your itching is most likely due to dry skin. -I recommend applying a thick layer of Vaseline or Aquaphor to the areas of dry skin daily.  This can help to heal the dry skin and help with itching.  2) I am sending refills of your medications  3) For the shoulder tingling, it is most likely due to irritation of the nerves. -Please reach out to us  if the pain becomes uncontrolled or you start to have weakness in your arm  4) It looks like you were supposed to get a prostate lab checked by your urologist. I am going to check that today and reach out if it is abnormal.  Come back to see me in 1 year.

## 2023-08-12 ENCOUNTER — Encounter: Payer: Self-pay | Admitting: Family Medicine

## 2023-08-12 NOTE — Progress Notes (Signed)
 Pt PSA elevated. Sending letter with [facility]'s contact information and advised pt to schedule follow-up.

## 2023-08-14 NOTE — Assessment & Plan Note (Signed)
 Diet controlled. A1c at goal. F/u UACR.

## 2023-08-14 NOTE — Assessment & Plan Note (Signed)
 Lipid panel and BMP today - refill for lipitor

## 2023-08-14 NOTE — Assessment & Plan Note (Signed)
 At goal. BMP today. - Refill for losartan

## 2023-08-15 LAB — LIPID PANEL
Chol/HDL Ratio: 2.3 {ratio} (ref 0.0–5.0)
Cholesterol, Total: 139 mg/dL (ref 100–199)
HDL: 61 mg/dL (ref >39)
LDL Chol Calc (NIH): 65 mg/dL (ref 0–99)
Triglycerides: 64 mg/dL (ref 0–149)
VLDL Cholesterol Cal: 13 mg/dL (ref 5–40)

## 2023-08-15 LAB — BASIC METABOLIC PANEL
BUN/Creatinine Ratio: 16 (ref 10–24)
BUN: 13 mg/dL (ref 8–27)
CO2: 27 mmol/L (ref 20–29)
Calcium: 8.8 mg/dL (ref 8.6–10.2)
Chloride: 103 mmol/L (ref 96–106)
Creatinine, Ser: 0.82 mg/dL (ref 0.76–1.27)
Glucose: 114 mg/dL — ABNORMAL HIGH (ref 70–99)
Potassium: 4.9 mmol/L (ref 3.5–5.2)
Sodium: 142 mmol/L (ref 134–144)
eGFR: 94 mL/min/{1.73_m2} (ref >59)

## 2023-08-15 LAB — MICROALBUMIN / CREATININE URINE RATIO
Creatinine, Urine: 81 mg/dL
Microalb/Creat Ratio: 10 mg/g{creat} (ref 0–29)
Microalbumin, Urine: 8.2 ug/mL

## 2023-08-15 LAB — PSA: Prostate Specific Ag, Serum: 4.3 ng/mL — ABNORMAL HIGH (ref 0.0–4.0)

## 2023-09-21 ENCOUNTER — Encounter: Payer: Self-pay | Admitting: Family Medicine
